# Patient Record
Sex: Male | Born: 1945 | Race: White | Hispanic: No | Marital: Married | Smoking: Never smoker
Health system: Southern US, Community
[De-identification: ages and names within clinical notes are randomized; demographics above are authoritative.]

## PROBLEM LIST (undated history)

## (undated) DIAGNOSIS — E039 Hypothyroidism, unspecified: Secondary | ICD-10-CM

## (undated) DIAGNOSIS — M810 Age-related osteoporosis without current pathological fracture: Secondary | ICD-10-CM

## (undated) DIAGNOSIS — M19011 Primary osteoarthritis, right shoulder: Secondary | ICD-10-CM

## (undated) HISTORY — PX: OTHER SURGICAL HISTORY: SHX169

## (undated) HISTORY — PX: THYROIDECTOMY, PARTIAL: SHX18

## (undated) HISTORY — DX: Hypothyroidism, unspecified: E03.9

## (undated) HISTORY — PX: KNEE ARTHROSCOPY: SUR90

---

## 2007-08-20 ENCOUNTER — Encounter: Admission: RE | Admit: 2007-08-20 | Discharge: 2007-08-20 | Payer: Self-pay | Admitting: Neurosurgery

## 2009-09-11 ENCOUNTER — Encounter: Admission: RE | Admit: 2009-09-11 | Discharge: 2009-09-11 | Payer: Self-pay | Admitting: Neurosurgery

## 2010-09-12 ENCOUNTER — Encounter: Payer: Self-pay | Admitting: Neurosurgery

## 2011-09-14 DIAGNOSIS — K625 Hemorrhage of anus and rectum: Secondary | ICD-10-CM | POA: Diagnosis not present

## 2011-10-13 DIAGNOSIS — K625 Hemorrhage of anus and rectum: Secondary | ICD-10-CM | POA: Diagnosis not present

## 2011-10-13 DIAGNOSIS — K648 Other hemorrhoids: Secondary | ICD-10-CM | POA: Diagnosis not present

## 2011-11-23 DIAGNOSIS — Z85828 Personal history of other malignant neoplasm of skin: Secondary | ICD-10-CM | POA: Diagnosis not present

## 2011-11-23 DIAGNOSIS — L570 Actinic keratosis: Secondary | ICD-10-CM | POA: Diagnosis not present

## 2011-12-20 DIAGNOSIS — H01009 Unspecified blepharitis unspecified eye, unspecified eyelid: Secondary | ICD-10-CM | POA: Diagnosis not present

## 2012-02-17 DIAGNOSIS — H902 Conductive hearing loss, unspecified: Secondary | ICD-10-CM | POA: Diagnosis not present

## 2012-02-17 DIAGNOSIS — H6500 Acute serous otitis media, unspecified ear: Secondary | ICD-10-CM | POA: Diagnosis not present

## 2012-02-27 DIAGNOSIS — H908 Mixed conductive and sensorineural hearing loss, unspecified: Secondary | ICD-10-CM | POA: Diagnosis not present

## 2012-02-27 DIAGNOSIS — H9319 Tinnitus, unspecified ear: Secondary | ICD-10-CM | POA: Diagnosis not present

## 2012-02-27 DIAGNOSIS — H6980 Other specified disorders of Eustachian tube, unspecified ear: Secondary | ICD-10-CM | POA: Diagnosis not present

## 2012-03-09 DIAGNOSIS — K921 Melena: Secondary | ICD-10-CM | POA: Diagnosis not present

## 2012-03-09 DIAGNOSIS — H6590 Unspecified nonsuppurative otitis media, unspecified ear: Secondary | ICD-10-CM | POA: Diagnosis not present

## 2012-03-09 DIAGNOSIS — E039 Hypothyroidism, unspecified: Secondary | ICD-10-CM | POA: Diagnosis not present

## 2012-03-26 DIAGNOSIS — H6980 Other specified disorders of Eustachian tube, unspecified ear: Secondary | ICD-10-CM | POA: Diagnosis not present

## 2012-05-09 DIAGNOSIS — D126 Benign neoplasm of colon, unspecified: Secondary | ICD-10-CM | POA: Diagnosis not present

## 2012-05-09 DIAGNOSIS — K921 Melena: Secondary | ICD-10-CM | POA: Diagnosis not present

## 2012-05-30 DIAGNOSIS — M19019 Primary osteoarthritis, unspecified shoulder: Secondary | ICD-10-CM | POA: Diagnosis not present

## 2012-06-01 ENCOUNTER — Other Ambulatory Visit: Payer: Self-pay | Admitting: Orthopedic Surgery

## 2012-06-01 DIAGNOSIS — M25512 Pain in left shoulder: Secondary | ICD-10-CM

## 2012-06-05 ENCOUNTER — Ambulatory Visit
Admission: RE | Admit: 2012-06-05 | Discharge: 2012-06-05 | Disposition: A | Discharge status: Home / Self Care | Payer: Medicare Other | Source: Ambulatory Visit | Attending: Orthopedic Surgery | Admitting: Orthopedic Surgery

## 2012-06-05 DIAGNOSIS — M67919 Unspecified disorder of synovium and tendon, unspecified shoulder: Secondary | ICD-10-CM | POA: Diagnosis not present

## 2012-06-05 DIAGNOSIS — M75100 Unspecified rotator cuff tear or rupture of unspecified shoulder, not specified as traumatic: Secondary | ICD-10-CM | POA: Diagnosis not present

## 2012-06-05 DIAGNOSIS — M25512 Pain in left shoulder: Secondary | ICD-10-CM

## 2012-06-05 DIAGNOSIS — M719 Bursopathy, unspecified: Secondary | ICD-10-CM | POA: Diagnosis not present

## 2012-06-11 DIAGNOSIS — M25519 Pain in unspecified shoulder: Secondary | ICD-10-CM | POA: Diagnosis not present

## 2012-06-11 DIAGNOSIS — M19019 Primary osteoarthritis, unspecified shoulder: Secondary | ICD-10-CM | POA: Diagnosis not present

## 2012-06-26 DIAGNOSIS — M25519 Pain in unspecified shoulder: Secondary | ICD-10-CM | POA: Diagnosis not present

## 2012-06-26 DIAGNOSIS — M19019 Primary osteoarthritis, unspecified shoulder: Secondary | ICD-10-CM | POA: Diagnosis not present

## 2012-06-29 DIAGNOSIS — L570 Actinic keratosis: Secondary | ICD-10-CM | POA: Diagnosis not present

## 2012-06-29 DIAGNOSIS — C44319 Basal cell carcinoma of skin of other parts of face: Secondary | ICD-10-CM | POA: Diagnosis not present

## 2012-06-29 DIAGNOSIS — Z85828 Personal history of other malignant neoplasm of skin: Secondary | ICD-10-CM | POA: Diagnosis not present

## 2012-06-29 DIAGNOSIS — M62838 Other muscle spasm: Secondary | ICD-10-CM | POA: Diagnosis not present

## 2012-06-29 DIAGNOSIS — D485 Neoplasm of uncertain behavior of skin: Secondary | ICD-10-CM | POA: Diagnosis not present

## 2012-07-03 DIAGNOSIS — M62838 Other muscle spasm: Secondary | ICD-10-CM | POA: Diagnosis not present

## 2012-07-05 DIAGNOSIS — M6688 Spontaneous rupture of other tendons, other: Secondary | ICD-10-CM | POA: Diagnosis not present

## 2012-07-05 DIAGNOSIS — M19019 Primary osteoarthritis, unspecified shoulder: Secondary | ICD-10-CM | POA: Diagnosis not present

## 2012-07-16 DIAGNOSIS — M25519 Pain in unspecified shoulder: Secondary | ICD-10-CM | POA: Diagnosis not present

## 2012-07-16 DIAGNOSIS — M19019 Primary osteoarthritis, unspecified shoulder: Secondary | ICD-10-CM | POA: Diagnosis not present

## 2012-07-23 ENCOUNTER — Other Ambulatory Visit: Payer: Self-pay | Admitting: Orthopedic Surgery

## 2012-07-23 DIAGNOSIS — M25511 Pain in right shoulder: Secondary | ICD-10-CM

## 2012-07-25 DIAGNOSIS — M19019 Primary osteoarthritis, unspecified shoulder: Secondary | ICD-10-CM | POA: Diagnosis not present

## 2012-07-26 DIAGNOSIS — M19019 Primary osteoarthritis, unspecified shoulder: Secondary | ICD-10-CM | POA: Diagnosis not present

## 2012-07-26 DIAGNOSIS — S43429A Sprain of unspecified rotator cuff capsule, initial encounter: Secondary | ICD-10-CM | POA: Diagnosis not present

## 2012-08-02 DIAGNOSIS — M19019 Primary osteoarthritis, unspecified shoulder: Secondary | ICD-10-CM | POA: Diagnosis not present

## 2012-08-13 DIAGNOSIS — H01009 Unspecified blepharitis unspecified eye, unspecified eyelid: Secondary | ICD-10-CM | POA: Diagnosis not present

## 2012-08-13 DIAGNOSIS — H5310 Unspecified subjective visual disturbances: Secondary | ICD-10-CM | POA: Diagnosis not present

## 2012-08-13 DIAGNOSIS — H43399 Other vitreous opacities, unspecified eye: Secondary | ICD-10-CM | POA: Diagnosis not present

## 2012-08-31 DIAGNOSIS — J069 Acute upper respiratory infection, unspecified: Secondary | ICD-10-CM | POA: Diagnosis not present

## 2012-09-04 DIAGNOSIS — M25519 Pain in unspecified shoulder: Secondary | ICD-10-CM | POA: Diagnosis not present

## 2012-09-13 DIAGNOSIS — Z0000 Encounter for general adult medical examination without abnormal findings: Secondary | ICD-10-CM | POA: Diagnosis not present

## 2012-09-13 DIAGNOSIS — M19019 Primary osteoarthritis, unspecified shoulder: Secondary | ICD-10-CM | POA: Diagnosis not present

## 2012-09-13 DIAGNOSIS — Z23 Encounter for immunization: Secondary | ICD-10-CM | POA: Diagnosis not present

## 2012-09-13 DIAGNOSIS — E039 Hypothyroidism, unspecified: Secondary | ICD-10-CM | POA: Diagnosis not present

## 2012-09-13 DIAGNOSIS — Z125 Encounter for screening for malignant neoplasm of prostate: Secondary | ICD-10-CM | POA: Diagnosis not present

## 2012-10-05 DIAGNOSIS — M25519 Pain in unspecified shoulder: Secondary | ICD-10-CM | POA: Diagnosis not present

## 2012-10-19 DIAGNOSIS — M1710 Unilateral primary osteoarthritis, unspecified knee: Secondary | ICD-10-CM | POA: Diagnosis not present

## 2012-11-30 DIAGNOSIS — R229 Localized swelling, mass and lump, unspecified: Secondary | ICD-10-CM | POA: Diagnosis not present

## 2012-12-18 ENCOUNTER — Encounter (INDEPENDENT_AMBULATORY_CARE_PROVIDER_SITE_OTHER): Payer: Self-pay | Admitting: General Surgery

## 2012-12-18 ENCOUNTER — Ambulatory Visit (INDEPENDENT_AMBULATORY_CARE_PROVIDER_SITE_OTHER): Payer: Medicare Other | Admitting: General Surgery

## 2012-12-18 VITALS — BP 102/76 | HR 72 | Resp 16 | Ht 74.0 in | Wt 193.2 lb

## 2012-12-18 DIAGNOSIS — D235 Other benign neoplasm of skin of trunk: Secondary | ICD-10-CM

## 2012-12-18 NOTE — Patient Instructions (Signed)
Activities as tolerated for now.  Light activities after the surgery as we discussed.

## 2012-12-18 NOTE — Progress Notes (Signed)
Patient ID: Greg Santiago, male   DOB: Jan 06, 1946, 67 y.o.   MRN: [id number]  No chief complaint on file.   HPI Greg Santiago is a 67 y.o. male.  HPI  He is referred by Dr. Valentina Lucks because of an enlarging and intermittently painful soft tissue mass in the left lower back. The mass is then there for a number of years but has been slowly getting larger. Certain exercises he does it becomes painful. He is interested in having it removed. He has a few other soft tissue masses similar to it on other areas of his body but they have not changed in size and they are not symptomatic.  Past Medical History  Diagnosis Date  . Hypothyroidism     Past Surgical History  Procedure Laterality Date  . Thyroidectomy, partial N/A   . Left shoulder surgery Left     History reviewed. No pertinent family history.  Social History History  Substance Use Topics  . Smoking status: Not on file  . Smokeless tobacco: Not on file  . Alcohol Use: Not on file    Not on File  Current Outpatient Prescriptions  Medication Sig Dispense Refill  . fish oil-omega-3 fatty acids 1000 MG capsule Take 2 g by mouth daily.      Marland Kitchen levothyroxine (SYNTHROID, LEVOTHROID) 125 MCG tablet Take 125 mcg by mouth daily before breakfast.      . Multiple Vitamin (MULTIVITAMIN) tablet Take 1 tablet by mouth daily.       No current facility-administered medications for this visit.    Review of Systems Review of Systems  Constitutional: Negative.   Respiratory: Negative.   Cardiovascular: Negative.   Gastrointestinal: Negative.   Genitourinary: Negative.   Hematological: Negative.     Blood pressure 102/76, pulse 72, resp. rate 16, height 6\' 2"  (1.88 m), weight 193 lb 3.2 oz (87.635 kg).  Physical Exam Physical Exam  Constitutional: He appears well-developed and well-nourished. No distress.  Musculoskeletal:  Left lower back, there is a 9 cm x 7 cm mobile subcutaneous mass. No significant tenderness. No erythema. No  drainage.    Data Reviewed Dr. Jone Baseman note  Assessment    Enlarging subcutaneous mass of back. This is a direct indication for removal.       Plan    Removal of back subcutaneous mass as an outpatient under local anesthesia. The procedure and risks were discussed with him and his wife. The risks include but are not limited to bleeding, infection, wound healing problems, reaction to local anesthesia. They seem to understand and would like to proceed.        Laporsche Hoeger J 12/18/2012, 2:04 PM

## 2012-12-27 DIAGNOSIS — L570 Actinic keratosis: Secondary | ICD-10-CM | POA: Diagnosis not present

## 2012-12-27 DIAGNOSIS — Z85828 Personal history of other malignant neoplasm of skin: Secondary | ICD-10-CM | POA: Diagnosis not present

## 2012-12-27 DIAGNOSIS — M62838 Other muscle spasm: Secondary | ICD-10-CM | POA: Diagnosis not present

## 2013-01-03 DIAGNOSIS — M25569 Pain in unspecified knee: Secondary | ICD-10-CM | POA: Diagnosis not present

## 2013-01-03 DIAGNOSIS — M1710 Unilateral primary osteoarthritis, unspecified knee: Secondary | ICD-10-CM | POA: Diagnosis not present

## 2013-01-04 ENCOUNTER — Other Ambulatory Visit (INDEPENDENT_AMBULATORY_CARE_PROVIDER_SITE_OTHER): Payer: Self-pay | Admitting: General Surgery

## 2013-01-04 DIAGNOSIS — D1739 Benign lipomatous neoplasm of skin and subcutaneous tissue of other sites: Secondary | ICD-10-CM | POA: Diagnosis not present

## 2013-01-21 ENCOUNTER — Ambulatory Visit (INDEPENDENT_AMBULATORY_CARE_PROVIDER_SITE_OTHER): Payer: Medicare Other | Admitting: General Surgery

## 2013-01-21 ENCOUNTER — Encounter (INDEPENDENT_AMBULATORY_CARE_PROVIDER_SITE_OTHER): Payer: Medicare Other | Admitting: General Surgery

## 2013-01-21 ENCOUNTER — Encounter (INDEPENDENT_AMBULATORY_CARE_PROVIDER_SITE_OTHER): Payer: Self-pay | Admitting: General Surgery

## 2013-01-21 VITALS — BP 116/72 | HR 56 | Temp 97.4°F | Resp 12 | Ht 74.0 in | Wt 194.8 lb

## 2013-01-21 DIAGNOSIS — Z9889 Other specified postprocedural states: Secondary | ICD-10-CM

## 2013-01-21 NOTE — Progress Notes (Signed)
Procedure:  Removal of lipoma from left back.  Date:  01/04/2013  Pathology:  Lipoma  History:  He is here for a postoperative visit and is doing well. He has no complaints  Exam: General- Is in NAD. Back-incision is clean and intact with no evidence of infection.  Assessment:  Doing well postoperatively.  Plan:  Resume normal activities. Followup as needed.

## 2013-01-21 NOTE — Patient Instructions (Signed)
May swim and do all activities.

## 2013-02-19 DIAGNOSIS — M542 Cervicalgia: Secondary | ICD-10-CM | POA: Diagnosis not present

## 2013-02-19 DIAGNOSIS — M999 Biomechanical lesion, unspecified: Secondary | ICD-10-CM | POA: Diagnosis not present

## 2013-02-19 DIAGNOSIS — M546 Pain in thoracic spine: Secondary | ICD-10-CM | POA: Diagnosis not present

## 2013-02-19 DIAGNOSIS — M9981 Other biomechanical lesions of cervical region: Secondary | ICD-10-CM | POA: Diagnosis not present

## 2013-02-21 DIAGNOSIS — M546 Pain in thoracic spine: Secondary | ICD-10-CM | POA: Diagnosis not present

## 2013-02-21 DIAGNOSIS — M999 Biomechanical lesion, unspecified: Secondary | ICD-10-CM | POA: Diagnosis not present

## 2013-02-21 DIAGNOSIS — M542 Cervicalgia: Secondary | ICD-10-CM | POA: Diagnosis not present

## 2013-02-21 DIAGNOSIS — M9981 Other biomechanical lesions of cervical region: Secondary | ICD-10-CM | POA: Diagnosis not present

## 2013-02-25 DIAGNOSIS — M999 Biomechanical lesion, unspecified: Secondary | ICD-10-CM | POA: Diagnosis not present

## 2013-02-25 DIAGNOSIS — M9981 Other biomechanical lesions of cervical region: Secondary | ICD-10-CM | POA: Diagnosis not present

## 2013-02-25 DIAGNOSIS — M542 Cervicalgia: Secondary | ICD-10-CM | POA: Diagnosis not present

## 2013-02-25 DIAGNOSIS — M546 Pain in thoracic spine: Secondary | ICD-10-CM | POA: Diagnosis not present

## 2013-03-19 DIAGNOSIS — M546 Pain in thoracic spine: Secondary | ICD-10-CM | POA: Diagnosis not present

## 2013-03-19 DIAGNOSIS — M9981 Other biomechanical lesions of cervical region: Secondary | ICD-10-CM | POA: Diagnosis not present

## 2013-03-19 DIAGNOSIS — M999 Biomechanical lesion, unspecified: Secondary | ICD-10-CM | POA: Diagnosis not present

## 2013-03-19 DIAGNOSIS — M542 Cervicalgia: Secondary | ICD-10-CM | POA: Diagnosis not present

## 2013-03-21 DIAGNOSIS — M542 Cervicalgia: Secondary | ICD-10-CM | POA: Diagnosis not present

## 2013-03-21 DIAGNOSIS — M546 Pain in thoracic spine: Secondary | ICD-10-CM | POA: Diagnosis not present

## 2013-03-21 DIAGNOSIS — M999 Biomechanical lesion, unspecified: Secondary | ICD-10-CM | POA: Diagnosis not present

## 2013-03-21 DIAGNOSIS — M9981 Other biomechanical lesions of cervical region: Secondary | ICD-10-CM | POA: Diagnosis not present

## 2013-03-25 DIAGNOSIS — M546 Pain in thoracic spine: Secondary | ICD-10-CM | POA: Diagnosis not present

## 2013-03-25 DIAGNOSIS — M9981 Other biomechanical lesions of cervical region: Secondary | ICD-10-CM | POA: Diagnosis not present

## 2013-03-25 DIAGNOSIS — M542 Cervicalgia: Secondary | ICD-10-CM | POA: Diagnosis not present

## 2013-03-25 DIAGNOSIS — M999 Biomechanical lesion, unspecified: Secondary | ICD-10-CM | POA: Diagnosis not present

## 2013-03-28 DIAGNOSIS — M542 Cervicalgia: Secondary | ICD-10-CM | POA: Diagnosis not present

## 2013-03-28 DIAGNOSIS — M546 Pain in thoracic spine: Secondary | ICD-10-CM | POA: Diagnosis not present

## 2013-03-28 DIAGNOSIS — M999 Biomechanical lesion, unspecified: Secondary | ICD-10-CM | POA: Diagnosis not present

## 2013-03-28 DIAGNOSIS — M9981 Other biomechanical lesions of cervical region: Secondary | ICD-10-CM | POA: Diagnosis not present

## 2013-04-02 DIAGNOSIS — M999 Biomechanical lesion, unspecified: Secondary | ICD-10-CM | POA: Diagnosis not present

## 2013-04-02 DIAGNOSIS — M542 Cervicalgia: Secondary | ICD-10-CM | POA: Diagnosis not present

## 2013-04-02 DIAGNOSIS — M9981 Other biomechanical lesions of cervical region: Secondary | ICD-10-CM | POA: Diagnosis not present

## 2013-04-02 DIAGNOSIS — M546 Pain in thoracic spine: Secondary | ICD-10-CM | POA: Diagnosis not present

## 2013-04-05 DIAGNOSIS — M999 Biomechanical lesion, unspecified: Secondary | ICD-10-CM | POA: Diagnosis not present

## 2013-04-05 DIAGNOSIS — M546 Pain in thoracic spine: Secondary | ICD-10-CM | POA: Diagnosis not present

## 2013-04-05 DIAGNOSIS — M9981 Other biomechanical lesions of cervical region: Secondary | ICD-10-CM | POA: Diagnosis not present

## 2013-04-05 DIAGNOSIS — M542 Cervicalgia: Secondary | ICD-10-CM | POA: Diagnosis not present

## 2013-05-15 DIAGNOSIS — M19019 Primary osteoarthritis, unspecified shoulder: Secondary | ICD-10-CM | POA: Diagnosis not present

## 2013-05-15 DIAGNOSIS — S43429A Sprain of unspecified rotator cuff capsule, initial encounter: Secondary | ICD-10-CM | POA: Diagnosis not present

## 2013-05-15 DIAGNOSIS — M25519 Pain in unspecified shoulder: Secondary | ICD-10-CM | POA: Diagnosis not present

## 2013-08-13 DIAGNOSIS — H43399 Other vitreous opacities, unspecified eye: Secondary | ICD-10-CM | POA: Diagnosis not present

## 2013-08-13 DIAGNOSIS — H1045 Other chronic allergic conjunctivitis: Secondary | ICD-10-CM | POA: Diagnosis not present

## 2013-09-05 DIAGNOSIS — M546 Pain in thoracic spine: Secondary | ICD-10-CM | POA: Diagnosis not present

## 2013-09-05 DIAGNOSIS — M999 Biomechanical lesion, unspecified: Secondary | ICD-10-CM | POA: Diagnosis not present

## 2013-09-06 DIAGNOSIS — M999 Biomechanical lesion, unspecified: Secondary | ICD-10-CM | POA: Diagnosis not present

## 2013-09-06 DIAGNOSIS — M546 Pain in thoracic spine: Secondary | ICD-10-CM | POA: Diagnosis not present

## 2013-09-09 DIAGNOSIS — M546 Pain in thoracic spine: Secondary | ICD-10-CM | POA: Diagnosis not present

## 2013-09-09 DIAGNOSIS — M999 Biomechanical lesion, unspecified: Secondary | ICD-10-CM | POA: Diagnosis not present

## 2014-02-10 ENCOUNTER — Other Ambulatory Visit: Payer: Self-pay | Admitting: Dermatology

## 2014-03-18 ENCOUNTER — Other Ambulatory Visit: Payer: Self-pay | Admitting: Internal Medicine

## 2014-03-18 DIAGNOSIS — M79604 Pain in right leg: Secondary | ICD-10-CM

## 2014-03-18 DIAGNOSIS — M545 Low back pain, unspecified: Secondary | ICD-10-CM

## 2014-03-19 ENCOUNTER — Ambulatory Visit
Admission: RE | Admit: 2014-03-19 | Discharge: 2014-03-19 | Disposition: A | Discharge status: Home / Self Care | Payer: Medicare Other | Source: Ambulatory Visit | Attending: Internal Medicine | Admitting: Internal Medicine

## 2014-03-19 DIAGNOSIS — M545 Low back pain, unspecified: Secondary | ICD-10-CM

## 2014-04-04 ENCOUNTER — Other Ambulatory Visit: Payer: Self-pay | Admitting: Internal Medicine

## 2014-04-04 ENCOUNTER — Ambulatory Visit
Admission: RE | Admit: 2014-04-04 | Discharge: 2014-04-04 | Disposition: A | Discharge status: Home / Self Care | Payer: Medicare Other | Source: Ambulatory Visit | Attending: Internal Medicine | Admitting: Internal Medicine

## 2014-04-04 ENCOUNTER — Other Ambulatory Visit: Payer: Medicare Other

## 2014-04-04 VITALS — BP 138/66 | HR 54

## 2014-04-04 DIAGNOSIS — M545 Low back pain, unspecified: Secondary | ICD-10-CM

## 2014-04-04 MED ORDER — METHYLPREDNISOLONE ACETATE 40 MG/ML INJ SUSP (RADIOLOG
120.0000 mg | Freq: Once | INTRAMUSCULAR | Status: AC
  Administered 2014-04-04: 120 mg via EPIDURAL

## 2014-04-04 MED ORDER — IOHEXOL 180 MG/ML  SOLN
1.0000 mL | Freq: Once | INTRAMUSCULAR | Status: AC | PRN
  Administered 2014-04-04: 1 mL via EPIDURAL

## 2014-04-04 NOTE — Discharge Instructions (Signed)

## 2014-04-11 ENCOUNTER — Other Ambulatory Visit: Payer: Self-pay | Admitting: Internal Medicine

## 2014-04-11 DIAGNOSIS — M79604 Pain in right leg: Secondary | ICD-10-CM

## 2014-04-11 DIAGNOSIS — M545 Low back pain: Secondary | ICD-10-CM

## 2014-04-17 ENCOUNTER — Ambulatory Visit
Admission: RE | Admit: 2014-04-17 | Discharge: 2014-04-17 | Disposition: A | Discharge status: Home / Self Care | Payer: Medicare Other | Source: Ambulatory Visit | Attending: Internal Medicine | Admitting: Internal Medicine

## 2014-04-17 VITALS — BP 131/74 | HR 59

## 2014-04-17 DIAGNOSIS — M79604 Pain in right leg: Secondary | ICD-10-CM

## 2014-04-17 DIAGNOSIS — M545 Low back pain: Secondary | ICD-10-CM

## 2014-04-17 MED ORDER — METHYLPREDNISOLONE ACETATE 40 MG/ML INJ SUSP (RADIOLOG
120.0000 mg | Freq: Once | INTRAMUSCULAR | Status: AC
  Administered 2014-04-17: 120 mg via EPIDURAL

## 2014-04-17 MED ORDER — IOHEXOL 180 MG/ML  SOLN
1.0000 mL | Freq: Once | INTRAMUSCULAR | Status: AC | PRN
  Administered 2014-04-17: 1 mL via EPIDURAL

## 2014-04-25 ENCOUNTER — Institutional Professional Consult (permissible substitution): Payer: Medicare Other | Admitting: Internal Medicine

## 2014-05-30 ENCOUNTER — Other Ambulatory Visit: Payer: Self-pay | Admitting: Internal Medicine

## 2014-05-30 DIAGNOSIS — G8929 Other chronic pain: Secondary | ICD-10-CM

## 2014-05-30 DIAGNOSIS — M545 Low back pain: Principal | ICD-10-CM

## 2014-06-10 ENCOUNTER — Institutional Professional Consult (permissible substitution): Payer: Medicare Other | Admitting: Internal Medicine

## 2014-06-13 ENCOUNTER — Inpatient Hospital Stay: Admission: RE | Admit: 2014-06-13 | Payer: Medicare Other | Source: Ambulatory Visit

## 2014-08-18 ENCOUNTER — Ambulatory Visit (INDEPENDENT_AMBULATORY_CARE_PROVIDER_SITE_OTHER): Payer: Medicare Other | Admitting: Internal Medicine

## 2014-08-18 ENCOUNTER — Encounter: Payer: Self-pay | Admitting: Internal Medicine

## 2014-08-18 VITALS — BP 122/78 | HR 68 | Ht 74.0 in | Wt 204.4 lb

## 2014-08-18 DIAGNOSIS — M2550 Pain in unspecified joint: Secondary | ICD-10-CM

## 2014-08-18 NOTE — Progress Notes (Signed)
08/18/14- 68 yoM never smoker Self Referral-inflammation going on and unsure if related to allergies or not. No significant allergy hx. Somehow from a friend he thought I might have something to offer toward his complaint of multiple bilateral joint pains.  He has continued to exercise regularly in retirement. Pains are made worse by activity or sleep and helped by ice and stretching.  He denies significant nasal, chest or skin symptoms and is in generally good health. He had not talked with his PCP about this before coming over.   Prior to Admission medications   Medication Sig Start Date End Date Taking? Authorizing Provider  ALOE VERA JUICE PO Take by mouth.   Yes Historical Provider, MD  cholecalciferol (VITAMIN D) 1000 UNITS tablet Take 2,000 Units by mouth daily.   Yes Historical Provider, MD  Coenzyme Q10 (COQ10 PO) Take 1,000 mg by mouth daily.   Yes Historical Provider, MD  fish oil-omega-3 fatty acids 1000 MG capsule Take 2 g by mouth daily.   Yes Historical Provider, MD  magnesium gluconate (MAGONATE) 500 MG tablet Take 500 mg by mouth 2 (two) times daily.   Yes Historical Provider, MD  Multiple Vitamin (MULTIVITAMIN) tablet Take 1 tablet by mouth daily.   Yes Historical Provider, MD  OVER THE COUNTER MEDICATION Stem U flex  Take 2 tablets twice daily   Yes Historical Provider, MD  SYNTHROID 150 MCG tablet Take 1 tablet by mouth daily. 07/24/14  Yes Historical Provider, MD  vitamin C (ASCORBIC ACID) 500 MG tablet Take 500 mg by mouth daily.   Yes Historical Provider, MD   Past Medical History  Diagnosis Date  . Hypothyroidism    Past Surgical History  Procedure Laterality Date  . Thyroidectomy, partial N/A   . Left shoulder surgery Left    Family History  Problem Relation Age of Onset  . Diabetes Father    History   Social History  . Marital Status: Married    Spouse Name: N/A    Number of Children: N/A  . Years of Education: N/A   Occupational History  . retired     Social History Main Topics  . Smoking status: Never Smoker   . Smokeless tobacco: Not on file  . Alcohol Use: No  . Drug Use: No  . Sexual Activity: Not on file   Other Topics Concern  . Not on file   Social History Narrative   ROS-see HPI Constitutional:   No-   weight loss, night sweats, fevers, chills, fatigue, lassitude. HEENT:   No-  headaches, difficulty swallowing, tooth/dental problems, sore throat,       No-  sneezing, itching, ear ache, nasal congestion, post nasal drip,  CV:  No-   chest pain, orthopnea, PND, swelling in lower extremities, anasarca,                                  dizziness, palpitations Resp: No-   shortness of breath with exertion or at rest.              No-   productive cough,  No non-productive cough,  No- coughing up of blood.              No-   change in color of mucus.  No- wheezing.   Skin: No-   rash or lesions. GI:  No-   heartburn, indigestion, abdominal pain, nausea, vomiting, diarrhea,  change in bowel habits, loss of appetite GU: No-   dysuria, change in color of urine, no urgency or frequency.  No- flank pain. MS: + joint pain or swelling.  + decreased range of motion.  + back pain. Neuro-     nothing unusual Psych:  No- change in mood or affect. No depression or anxiety.  No memory loss.  OBJ- Physical Exam General- Alert, Oriented, Affect-appropriate, Distress- none acute Skin- rash-none, lesions- none, excoriation- none Lymphadenopathy- none Head- atraumatic            Eyes- Gross vision intact, PERRLA, conjunctivae and secretions clear            Ears- Hearing, canals-normal            Nose- Clear, no-Septal dev, mucus, polyps, erosion, perforation             Throat- Mallampati II , mucosa clear , drainage- none, tonsils- atrophic Neck- flexible , trachea midline, no stridor , thyroid nl, carotid no bruit Chest - symmetrical excursion , unlabored           Heart/CV- RRR , no murmur , no gallop  , no rub, nl s1  s2                           - JVD- none , edema- none, stasis changes- none, varices- none           Lung- clear to P&A, wheeze- none, cough- none , dullness-none, rub- none           Chest wall-  Abd- tender-no, distended-no, bowel sounds-present, HSM- no Br/ Gen/ Rectal- Not done, not indicated Extrem- cyanosis- none, clubbing, none, atrophy- none, strength- nl Neuro- grossly intact to observation

## 2014-08-18 NOTE — Patient Instructions (Signed)
We suggested you start by asking Dr Delene Ruffini assessment of your joint inflammation concerns. He may want to order labs such as sed rate, ANA, Rheumatoid factor- just for consideration. He might offer to send you to a Rheumatologist such as Dr Ouida Sills or Dr Charlestine Night.  Dr Adriana Simas and Dr Flossie Buffy are alternative medicine providers in the area.

## 2014-08-19 DIAGNOSIS — M2550 Pain in unspecified joint: Secondary | ICD-10-CM | POA: Insufficient documentation

## 2014-08-19 NOTE — Assessment & Plan Note (Signed)
Nothing suggests a respiratory allergy process, where I might help. More likely he has diffuse osteoarthritis and some degenerative disk disease.  Plan- He is going back to discuss with Dr Laurann Montana. If basic inflammatory markers are abnormal then he might be steered toward a rheumatologist. He pushed me for names, and also asked about alternative medicine providers.

## 2015-02-12 ENCOUNTER — Other Ambulatory Visit: Payer: Self-pay | Admitting: Internal Medicine

## 2015-02-12 ENCOUNTER — Ambulatory Visit
Admission: RE | Admit: 2015-02-12 | Discharge: 2015-02-12 | Disposition: A | Discharge status: Home / Self Care | Payer: Medicare Other | Source: Ambulatory Visit | Attending: Internal Medicine | Admitting: Internal Medicine

## 2015-02-12 DIAGNOSIS — M545 Low back pain, unspecified: Secondary | ICD-10-CM

## 2015-02-12 DIAGNOSIS — G8929 Other chronic pain: Secondary | ICD-10-CM

## 2015-02-12 MED ORDER — IOHEXOL 180 MG/ML  SOLN
1.0000 mL | Freq: Once | INTRAMUSCULAR | Status: AC | PRN
  Administered 2015-02-12: 1 mL via EPIDURAL

## 2015-02-12 MED ORDER — METHYLPREDNISOLONE ACETATE 40 MG/ML INJ SUSP (RADIOLOG
120.0000 mg | Freq: Once | INTRAMUSCULAR | Status: AC
  Administered 2015-02-12: 120 mg via EPIDURAL

## 2015-02-12 NOTE — Discharge Instructions (Signed)

## 2015-02-17 ENCOUNTER — Other Ambulatory Visit: Payer: Self-pay | Admitting: Internal Medicine

## 2015-02-17 ENCOUNTER — Ambulatory Visit
Admission: RE | Admit: 2015-02-17 | Discharge: 2015-02-17 | Disposition: A | Discharge status: Home / Self Care | Payer: Medicare Other | Source: Ambulatory Visit | Attending: Internal Medicine | Admitting: Internal Medicine

## 2015-02-17 ENCOUNTER — Other Ambulatory Visit: Payer: Medicare Other

## 2015-02-17 DIAGNOSIS — M5137 Other intervertebral disc degeneration, lumbosacral region: Secondary | ICD-10-CM

## 2015-02-17 MED ORDER — METHYLPREDNISOLONE ACETATE 40 MG/ML INJ SUSP (RADIOLOG
120.0000 mg | Freq: Once | INTRAMUSCULAR | Status: AC
  Administered 2015-02-17: 120 mg via INTRA_ARTICULAR

## 2015-02-17 MED ORDER — IOHEXOL 180 MG/ML  SOLN
1.0000 mL | Freq: Once | INTRAMUSCULAR | Status: AC | PRN
  Administered 2015-02-17: 1 mL via INTRA_ARTICULAR

## 2015-06-29 ENCOUNTER — Other Ambulatory Visit: Payer: Self-pay | Admitting: Orthopedic Surgery

## 2015-06-29 DIAGNOSIS — M75101 Unspecified rotator cuff tear or rupture of right shoulder, not specified as traumatic: Secondary | ICD-10-CM

## 2015-06-29 DIAGNOSIS — M1990 Unspecified osteoarthritis, unspecified site: Secondary | ICD-10-CM

## 2015-07-03 ENCOUNTER — Other Ambulatory Visit: Payer: Medicare Other

## 2015-07-13 ENCOUNTER — Other Ambulatory Visit: Payer: Medicare Other

## 2016-04-05 DIAGNOSIS — M5136 Other intervertebral disc degeneration, lumbar region: Secondary | ICD-10-CM | POA: Insufficient documentation

## 2016-04-05 DIAGNOSIS — M533 Sacrococcygeal disorders, not elsewhere classified: Secondary | ICD-10-CM | POA: Insufficient documentation

## 2016-04-05 DIAGNOSIS — M47816 Spondylosis without myelopathy or radiculopathy, lumbar region: Secondary | ICD-10-CM | POA: Insufficient documentation

## 2016-09-08 ENCOUNTER — Ambulatory Visit: Payer: Medicare Other | Admitting: Podiatry

## 2016-09-10 DIAGNOSIS — M1712 Unilateral primary osteoarthritis, left knee: Secondary | ICD-10-CM | POA: Diagnosis not present

## 2016-09-10 DIAGNOSIS — M76892 Other specified enthesopathies of left lower limb, excluding foot: Secondary | ICD-10-CM | POA: Diagnosis not present

## 2016-09-10 DIAGNOSIS — M7121 Synovial cyst of popliteal space [Baker], right knee: Secondary | ICD-10-CM | POA: Diagnosis not present

## 2016-09-19 DIAGNOSIS — M19012 Primary osteoarthritis, left shoulder: Secondary | ICD-10-CM | POA: Diagnosis not present

## 2016-09-19 DIAGNOSIS — Z125 Encounter for screening for malignant neoplasm of prostate: Secondary | ICD-10-CM | POA: Diagnosis not present

## 2016-09-19 DIAGNOSIS — E039 Hypothyroidism, unspecified: Secondary | ICD-10-CM | POA: Diagnosis not present

## 2016-09-19 DIAGNOSIS — Z0000 Encounter for general adult medical examination without abnormal findings: Secondary | ICD-10-CM | POA: Diagnosis not present

## 2016-09-19 DIAGNOSIS — M19011 Primary osteoarthritis, right shoulder: Secondary | ICD-10-CM | POA: Diagnosis not present

## 2016-09-19 DIAGNOSIS — Z1389 Encounter for screening for other disorder: Secondary | ICD-10-CM | POA: Diagnosis not present

## 2016-09-19 DIAGNOSIS — M179 Osteoarthritis of knee, unspecified: Secondary | ICD-10-CM | POA: Diagnosis not present

## 2016-09-19 DIAGNOSIS — M5137 Other intervertebral disc degeneration, lumbosacral region: Secondary | ICD-10-CM | POA: Diagnosis not present

## 2016-09-20 ENCOUNTER — Encounter: Payer: Self-pay | Admitting: Podiatry

## 2016-09-20 ENCOUNTER — Ambulatory Visit (INDEPENDENT_AMBULATORY_CARE_PROVIDER_SITE_OTHER): Payer: Medicare Other

## 2016-09-20 ENCOUNTER — Ambulatory Visit (INDEPENDENT_AMBULATORY_CARE_PROVIDER_SITE_OTHER): Payer: Medicare Other | Admitting: Podiatry

## 2016-09-20 VITALS — BP 92/62 | HR 60 | Resp 16 | Ht 74.0 in | Wt 190.0 lb

## 2016-09-20 DIAGNOSIS — L603 Nail dystrophy: Secondary | ICD-10-CM | POA: Diagnosis not present

## 2016-09-20 DIAGNOSIS — B351 Tinea unguium: Secondary | ICD-10-CM | POA: Diagnosis not present

## 2016-09-20 DIAGNOSIS — M2012 Hallux valgus (acquired), left foot: Secondary | ICD-10-CM | POA: Diagnosis not present

## 2016-09-20 DIAGNOSIS — M79672 Pain in left foot: Secondary | ICD-10-CM

## 2016-09-20 NOTE — Progress Notes (Signed)
   Subjective:    Patient ID: Greg Santiago, male    DOB: 08-Jul-1946, 71 y.o.   MRN: [id number]  HPI: Greg Santiago presents today with a chief concern of painful bunion first metatarsal phalangeal joint of the left foot 10 years he states this seems to be getting worse but is something tolerable that he can live with. He is also noticed that he has developed a callus to the medial aspect of the fourth toe of the left foot and the fourth toe rotates underneath the third toe and has 4 many months. He is also concerned about the thickness of his toenails and states that they are painful.    Review of Systems  All other systems reviewed and are negative.      Objective:   Physical Exam: Vital signs are stable alert and oriented 3 in no apparent distress. Pulses are strongly palpable. Neurologic sensorium is intact. Deep tendon reflexes are intact and muscle strength is normal bilateral. Orthopedic evaluation of his drains all joints distal to the ankle for range of motion without crepitation. Cutaneous evaluation of Mr. well-hydrated uterus thick yellow dystrophic onychomycotic nails some local debris and discoloration are noted. He has a large palpable nonpulsatile nodular mass overlying the first metatarsophalangeal joint of the left foot and to a lesser degree on the right foot. He has limited range of motion suspecting hallux limitus and osteoarthritic changes. Radiographs taken today confirm not only and rotated hammertoe deformity fourth digit of the left foot however it also demonstrates hallux rigidus with joint space narrowing subchondral sclerosis dorsal spurring CONSISTENT with osteoarthritic change. He also has a reactive hyperkeratosis medial aspect fourth digit left foot.        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 Reactive hyperkeratosis fourth digit left foot. Painful thick mycotic nails  may be dystrophic.  Plan: Samples of the nail were taken today and sent for pathologic evaluation. We also discussed surgical correction in great detail concerning the first metatarsophalangeal joint to a lesser degree the fourth toe. I also debrided the reactive hyperkeratotic lesion to the toe. I will follow-up with him in 1 month remember to ask how his wife is doing with her back surgery.

## 2016-10-18 ENCOUNTER — Ambulatory Visit: Payer: Medicare Other | Admitting: Podiatry

## 2016-10-26 ENCOUNTER — Ambulatory Visit: Payer: Self-pay

## 2016-10-26 ENCOUNTER — Ambulatory Visit (INDEPENDENT_AMBULATORY_CARE_PROVIDER_SITE_OTHER): Payer: Medicare Other | Admitting: Sports Medicine

## 2016-10-26 ENCOUNTER — Encounter: Payer: Self-pay | Admitting: Sports Medicine

## 2016-10-26 VITALS — BP 100/70 | HR 67 | Ht 74.0 in | Wt 197.0 lb

## 2016-10-26 DIAGNOSIS — S76012A Strain of muscle, fascia and tendon of left hip, initial encounter: Secondary | ICD-10-CM | POA: Diagnosis not present

## 2016-10-26 DIAGNOSIS — M25552 Pain in left hip: Secondary | ICD-10-CM

## 2016-10-26 MED ORDER — CELECOXIB 100 MG PO CAPS: 100.0000 mg | ORAL_CAPSULE | Freq: Two times a day (BID) | ORAL | 2 refills | Status: DC | PRN

## 2016-10-26 NOTE — Patient Instructions (Addendum)
You have a glute minimus strain Avoid anything that hurts.    Gluteal Strain A gluteal strain happens when the muscles in the buttocks (gluteal muscles) are overstretched or torn. A tear can be partial or complete. A gluteal strain can cause pain and stiffness in your buttocks, legs, and lower back. A strain might be referred to as "pulling a muscle." The severity of a gluteal strain is rated in degrees. First-degree strains have the least amount of muscle tearing and pain. Second-degree and third-degrees strains have increasingly more tearing and pain. What are the causes? There are many possible causes of gluteal strain, such as:  Stretching the muscles too far.  Putting too much stress on the muscles before they are warmed up.  Overusing the muscles.  Repetitive muscle movements over long periods of time.  Injury. What increases the risk? This condition is more likely to develop in:  People who are in cold weather.  People who are physically tired.  People with poor strength and flexibility.  People who do not warm up properly before physical activity.  People who do exercises or play sports with sudden bursts of activity.  People who have poor exercise technique. What are the signs or symptoms? Symptoms of this condition include:  Pain in the buttocks, especially when moving the legs. Pain may spread to the lower back or the legs.  Bruising and swelling of the buttocks.  Tenderness, weakness, or stiffness in the buttocks.  Muscle spasms. How is this diagnosed? This condition is diagnosed based on a physical exam and medical history. Your health care provider may do some range of motion exercises with you. You may have tests, such as MRI or X-rays. Your strain may be rated based on how severe it is. The ratings are:  Grade 1 strain (mild). Your muscles are overstretched. You might have very small muscle tears. This type of strain generally heals in about 1  week.  Grade 2 strain (moderate). Your muscles are partially torn. This may take 1 to 2 months to heal.  Grade 3 strain (severe). Your muscles are completely torn. A severe strain can take more than three months to heal. Grade 3 gluteal strains are rare. How is this treated? Treatment for this condition includes resting, icing, and raising (elevating) the injured area as much as possible. Your health care provider may recommend over-the-counter pain medicines. If your gluteal strain is severe or very painful, your health care provider may prescribe pain medicines or physical therapy. Surgery for severe strains is rare. Follow these instructions at home:  Take over-the-counter and prescription medicines only as told by your health care provider.  Do not drive or operate heavy machinery while taking prescription pain medicine.  Return to your normal activities as told by your health care provider. Ask your health care provider what activities are safe for you.  Rest your gluteal muscles as much as possible, especially for the first 2-3 days.  Begin exercising or stretching as told by your health care provider.  If directed, apply ice to the injured area:  Put ice in a plastic bag.  Place a towel between your skin and the bag.  Leave the ice on for 20 minutes, 2-3 times per day.  Keep all follow-up visits as told by your health care provider. This is important. How is this prevented?  Warm up and stretch before physical activity.  Stretch after physical activity.  Learn and use correct techniques for exercising and playing sports.  Avoid difficult physical activities if your muscles are tired or sore.  Strengthen your gluteal muscles and the surrounding muscles.  Develop your flexibility by stretching at least once a day. Contact a health care provider if:  You have pain or swelling that gets worse or does not get better with medicine.  You have "shooting" pain that moves  through your leg. Get help right away if:  You develop weakness in part of your body.  You lose feeling in part of your body.  You have severe pain.  You are unable to walk.  You have difficulty controlling your bladder or bowel movements. This information is not intended to replace advice given to you by your health care provider. Make sure you discuss any questions you have with your health care provider. Document Released: 06/05/2009 Document Revised: 01/14/2016 Document Reviewed: 12/24/2014 Elsevier Interactive Patient Education  2017 [employer].

## 2016-10-26 NOTE — Progress Notes (Signed)
Pre visit review using our clinic review tool, if applicable. No additional management support is needed unless otherwise documented below in the visit note. 

## 2016-10-27 DIAGNOSIS — S76012A Strain of muscle, fascia and tendon of left hip, initial encounter: Secondary | ICD-10-CM | POA: Insufficient documentation

## 2016-10-27 NOTE — Progress Notes (Signed)
Greg Santiago - 71 y.o. male MRN [id number]  Date of birth: 1946-07-25  Office Visit Note: Visit Date: 10/26/2016 PCP: Irven Shelling, MD Referred by: Lavone Orn, MD  Subjective: Chief Complaint  Patient presents with  . Left Hip Injury on 10/26/2016    Incident happened while swinging a golf club, felt the pelvic/hip area "muscle"grabbed with a sharp pain. Walking up/down steps and the finish of his golf swing triggers the pain. Denies any radiation of pain. Previous HX of soreness in the area but feels its caused by daily exercise routines.    HPI: Patient presents after acute injury today while playing golf with the [employer].  He had acute onset of left anterior groin/thigh pain.  This area is been problematic for him for several weeks and he has been working with a Physiological scientist and using a foam roller aggressively with only minimal improvement in that timeframe.  He reports feeling acute onset when this occurred today.  He has taken ibuprofen with moderate improvement in his symptoms but he has a history of esophagitis.  He has been performing therapeutic exercises with his personal trainer working on Product/process development scientist.  He does have history of low back issues with this seem significantly different than that.    ROS: Denies fevers, chills, recent weight gain or weight loss.  No night sweats. No significant nighttime awakenings due to this issue.  Otherwise per HPI.  Objective:  VS:  HT:6\' 2"  (188 cm)   WT:197 lb (89.4 kg)  BMI:25.3    BP:100/70  HR:67bpm  TEMP: ( )  RESP:97 % Physical Exam: GENERAL:  WDWN, NAD, Non-toxic appearing PSYCH:  Alert & appropriately interactive  Not depressed or anxious appearing LOWER EXTREMITIES:  Patient has bruising along the anterior lateral iliac crest there is petechial in nature and consistent with traction injury.  No significant large ecchymosis.  No significant pretibial edema.  No clubbing  or cyanosis.  DP & PT pulses 2+/4.  Sensation intact to light touch. BACK:   Bilateral negative straight leg raise.  No significant midline tenderness.  Tender to palpation over the left gluteal minimus and minimal TTP over the left greater trochanter  Good internal and external rotation of the hips.  Normal logroll, Stinchfield and FADIR/Faber  Manual muscle testing is 5+/5 in BLE myotomes without focality  Lower extremity DTRs 2+/4 diffusely and symmetric  He does have marked pain with isolated gluten minimus testing and a AB adducted and externally rotated position.   Imaging & Procedures: No results found. LIMITED MSK ULTRASOUND OF left hip Images were obtained and interpreted by myself, Teresa Coombs, DO  Images have been saved and stored to PACS system. Images obtained on: GE S7 Ultrasound machine  FINDINGS:   Greater trochanters overall normal appearing.  Intra-articular structures have only minimal degenerative change with no hip effusion.  Glute minimus muscle belly has a mid substance area of hypoechoic change with associated neovascularity  Iliopsoas tendon is mildly calcific  IMPRESSION:  1. Glute minimus grade 2 tear    Assessment & Plan: Problem List Items Addressed This Visit    Muscle strain of left gluteal region - Primary    Symptoms are consistent with a acute injury to underlying chronic issue.  I suspect he has had some underlying dysfunction that was exacerbated during his golf swing today.  We will plan for anti-inflammatories ice and avoidance of exacerbating activities for the next several weeks.  Follow-up in  2 weeks for consideration of transition to nitroglycerin patch, repeat MSK ultrasound and likely additional therapeutic exercises with focus on glute minimus at that time       Other Visit Diagnoses    Left hip pain       Relevant Orders   [place] LIMITED JOINT SPACE STRUCTURES LOW LEFT      Follow-up: Return in about 2 weeks  (around 11/09/2016).   Past Medical/Family/Surgical/Social History: Medications & Allergies reviewed per EMR Patient Active Problem List   Diagnosis Date Noted  . Muscle strain of left gluteal region 10/27/2016  . Polyarthralgia 08/19/2014  . Benign neoplasm of skin of trunk, left lower back 12/18/2012   Past Medical History:  Diagnosis Date  . Hypothyroidism    Family History  Problem Relation Age of Onset  . Diabetes Father    Past Surgical History:  Procedure Laterality Date  . left shoulder surgery Left   . THYROIDECTOMY, PARTIAL N/A    Social History   Occupational History  . retired    Social History Main Topics  . Smoking status: Never Smoker  . Smokeless tobacco: Never Used  . Alcohol use No  . Drug use: No  . Sexual activity: Not on file

## 2016-10-27 NOTE — Assessment & Plan Note (Signed)
Symptoms are consistent with a acute injury to underlying chronic issue.  I suspect he has had some underlying dysfunction that was exacerbated during his golf swing today.  We will plan for anti-inflammatories ice and avoidance of exacerbating activities for the next several weeks.  Follow-up in 2 weeks for consideration of transition to nitroglycerin patch, repeat MSK ultrasound and likely additional therapeutic exercises with focus on glute minimus at that time

## 2016-11-02 ENCOUNTER — Ambulatory Visit (INDEPENDENT_AMBULATORY_CARE_PROVIDER_SITE_OTHER): Payer: Medicare Other | Admitting: Sports Medicine

## 2016-11-02 ENCOUNTER — Encounter: Payer: Self-pay | Admitting: Sports Medicine

## 2016-11-02 VITALS — BP 104/80 | HR 74 | Ht 74.0 in | Wt 192.0 lb

## 2016-11-02 DIAGNOSIS — M25552 Pain in left hip: Secondary | ICD-10-CM

## 2016-11-02 DIAGNOSIS — S76012A Strain of muscle, fascia and tendon of left hip, initial encounter: Secondary | ICD-10-CM

## 2016-11-02 MED ORDER — NITROGLYCERIN 0.2 MG/HR TD PT24: MEDICATED_PATCH | TRANSDERMAL | 1 refills | Status: DC

## 2016-11-02 NOTE — Progress Notes (Signed)
Greg Santiago - 71 y.o. male MRN [id number]  Date of birth: 07/09/1946  Office Visit Note: Visit Date: 11/02/2016 PCP: Irven Shelling, MD Referred by: Lavone Orn, MD  Subjective: Chief Complaint  Patient presents with  . Follow-up Muscle Strain of the Gluteal Region    Sx are improving since last visit. He is icing the area from 4-6 times per day. Pt is taking Celebrex which he can tell some diference, no reported s/e. He has been doing some in home exercises which he would like to discuss in detail with Provider.   HPI: Patient reports he is actually doing remarkably better than at last visit.  He has multiple questions today regarding appropriate therapeutic exercises.  Celebrex has been significantly helpful ROS:   Otherwise per HPI.  Objective:  VS:  HT:6\' 2"  (188 cm)   WT:192 lb (87.1 kg)  BMI:24.7    BP:104/80  HR:74bpm  TEMP: ( )  RESP:97 % Physical Exam: GENERAL:  WDWN, NAD, Non-toxic appearing PSYCH:  Alert & appropriately interactive  Not depressed or anxious appearing Left hip: Only mild tenderness to palpation over the left Glute minimus/lateral pelvic girdle.  Overall good range of motion.  Still focally weak and pain reported with isolated Glute minimus testing.  Bilateral negative straight leg raise.  Negative logroll, FADIR, [place].  Imaging & Procedures: No results found.   PROCEDURE NOTE: THERAPEUTIC EXERCISES (97110) 15 minutes spent for Therapeutic exercises as stated in above notes.  This included exercises focusing on stretching, strengthening, with significant focus on eccentric aspects.   Proper technique shown and discussed handout in great detail with myself.  This was spent in addition to the 20 minutes spent in direct patient counseling and coordination of care.  Total visit time of 35 minutes.  All questions were discussed and answered.    Assessment & Plan: Problem List Items Addressed This Visit    Muscle strain of left gluteal  region - Primary    Extensive time spent in one-to-one education regarding appropriate therapeutic exercises.  Patient is doing a extensive core routine which is significantly helped his back in the past.  He is also recently changed his golf swing working with the probe at [employer].  He has not seen a physical therapist for these types of conditions in the past.  He has tried dry needling in the past and is interested in looking this more and do think this could be beneficial.  I will plan to send him to Mali Parker at [facility] physical therapy for evaluation of his golf swing.  We will additionally begin working on nitroglycerin protocol in 1 week after the acute phase and this was discussed in detail with him today.  We will plan to reevaluate in 4 weeks with repeat ultrasound if any persistent symptoms.        Relevant Orders   Ambulatory referral to Physical Therapy    Other Visit Diagnoses    Left hip pain       Relevant Orders   Ambulatory referral to Physical Therapy      Follow-up: Return in about 3 weeks (around 11/23/2016) for repeat diagnostic ultrasound.   Past Medical/Family/Surgical/Social History: Medications & Allergies reviewed per EMR Patient Active Problem List   Diagnosis Date Noted  . Muscle strain of left gluteal region 10/27/2016  . Polyarthralgia 08/19/2014  . Benign neoplasm of skin of trunk, left lower back 12/18/2012   Past Medical History:  Diagnosis Date  .  Hypothyroidism    Family History  Problem Relation Age of Onset  . Diabetes Father    Past Surgical History:  Procedure Laterality Date  . left shoulder surgery Left   . THYROIDECTOMY, PARTIAL N/A    Social History   Occupational History  . retired    Social History Main Topics  . Smoking status: Never Smoker  . Smokeless tobacco: Never Used  . Alcohol use No  . Drug use: No  . Sexual activity: Not on file

## 2016-11-02 NOTE — Patient Instructions (Addendum)
I am referring you to Mali Parker, PT at [facility].  We will place a referral but his telephone number is [phone].  He will be able to help you with your golf swing.  Please follow-up in 3 weeks.   Nitroglycerin Protocol   Apply 1/4 nitroglycerin patch to affected area daily.  Change position of patch within the affected area every 24 hours.  You may experience a headache during the first 1-2 weeks of using the patch, these should subside.  If you experience headaches after beginning nitroglycerin patch treatment, you may take your preferred over the counter pain reliever.  Another side effect of the nitroglycerin patch is skin irritation or rash related to patch adhesive.  Please notify our office if you develop more severe headaches or rash, and stop the patch.  Tendon healing with nitroglycerin patch may require 12 to 24 weeks depending on the extent of injury.  Men should not use if taking Viagra, Cialis, or Levitra.   Do not use if you have migraines or rosacea.

## 2016-11-06 NOTE — Assessment & Plan Note (Addendum)
Extensive time spent in one-to-one education regarding appropriate therapeutic exercises.  Patient is doing a extensive core routine which is significantly helped his back in the past.  He is also recently changed his golf swing working with the probe at [employer].  He has not seen a physical therapist for these types of conditions in the past.  He has tried dry needling in the past and is interested in looking this more and do think this could be beneficial.  I will plan to send him to Mali Parker at [facility] physical therapy for evaluation of his golf swing.  We will additionally begin working on nitroglycerin protocol in 1 week after the acute phase and this was discussed in detail with him today.  We will plan to reevaluate in 4 weeks with repeat ultrasound if any persistent symptoms.

## 2016-11-09 ENCOUNTER — Ambulatory Visit: Payer: Medicare Other | Admitting: Sports Medicine

## 2016-11-16 DIAGNOSIS — S76012A Strain of muscle, fascia and tendon of left hip, initial encounter: Secondary | ICD-10-CM | POA: Diagnosis not present

## 2016-11-21 DIAGNOSIS — S76012A Strain of muscle, fascia and tendon of left hip, initial encounter: Secondary | ICD-10-CM | POA: Diagnosis not present

## 2016-11-23 ENCOUNTER — Ambulatory Visit: Payer: Self-pay

## 2016-11-23 ENCOUNTER — Ambulatory Visit (INDEPENDENT_AMBULATORY_CARE_PROVIDER_SITE_OTHER): Payer: Medicare Other | Admitting: Sports Medicine

## 2016-11-23 ENCOUNTER — Encounter: Payer: Self-pay | Admitting: Sports Medicine

## 2016-11-23 VITALS — BP 102/70 | HR 80 | Ht 74.0 in | Wt 193.4 lb

## 2016-11-23 DIAGNOSIS — S76012D Strain of muscle, fascia and tendon of left hip, subsequent encounter: Secondary | ICD-10-CM

## 2016-11-23 NOTE — Patient Instructions (Signed)
Try using the nitroglycerin patch on both sides and we marked today.  Continue with your exercises.  I am okay with you beginning to take chip shots and progressing over the next 2 weeks.  Anticipate your being able to return to the 9 hole golf game in 2 weeks.

## 2016-11-23 NOTE — Progress Notes (Signed)
OFFICE VISIT NOTE Greg Santiago, [facility]  Masato Pettie - 71 y.o. male MRN [id number]  Date of birth: [birth year]  Visit Date: 11/23/2016  PCP: Lavone Orn, MD   Referred by: Lavone Orn, MD  SUBJECTIVE:   Chief Complaint  Patient presents with  . Muscle strain of left gluteal region    Pt here to f/u on muscle strain. His sx improved but have not completely resolved. Pain is about 0-1. Pain is just with certain movements (sneezing, laying on the left side, sudden movement, golf swing).    HPI: As above. Additional pertinent information includes:  Overall significant better.  Continue with physical therapy.  He is progressing with his activities and has no pain with day-to-day activity.  Interested in returning to golf.   ROS  Otherwise per HPI.  HISTORY & PERTINENT PRIOR DATA:  No specialty comments available. He reports that he has never smoked. He has never used smokeless tobacco. No results for input(s): HGBA1C, LABURIC in the last 8760 hours. Medications & Allergies reviewed per EMR Patient Active Problem List   Diagnosis Date Noted  . Primary osteoarthritis of left knee 12/21/2016  . Chronic pain of left knee 12/21/2016  . Muscle strain of left gluteal region 10/27/2016  . Polyarthralgia 08/19/2014  . Benign neoplasm of skin of trunk, left lower back 12/18/2012   Past Medical History:  Diagnosis Date  . Hypothyroidism    Family History  Problem Relation Age of Onset  . Diabetes Father    Past Surgical History:  Procedure Laterality Date  . left shoulder surgery Left   . THYROIDECTOMY, PARTIAL N/A    Social History   Occupational History  . retired    Social History Main Topics  . Smoking status: Never Smoker  . Smokeless tobacco: Never Used  . Alcohol use No  . Drug use: No  . Sexual activity: Not on file    OBJECTIVE:  VS:  HT:6\' 2"  (188 cm)   WT:193 lb 6.4 oz  (87.7 kg)  BMI:24.9    BP:102/70  HR:80bpm  TEMP: ( )  RESP:94 % Physical Exam: WDWN, NAD, Non-toxic appearing Alert & appropriately interactive Not depressed or anxious appearing No increased work of breathing. Pupils are equal. EOM intact without nystagmus No clubbing or cyanosis of the extremities appreciated No significant rashes/lesions/ulcerations overlying the examined area. DP & PT pulses 2+/4.  No significant pretibial edema. Sensation intact to light touch in upper and lower extremities. Small amount of weakness with resisted hip abduction and.  Tensor fascia lata predominance remains.   IMAGING & PROCEDURES: No results found. No additional findings.  LIMITED MSK ULTRASOUND OF LEFT HIP Images were obtained and interpreted by myself, Teresa Coombs, DO  Images have been saved and stored to PACS system. Images obtained on: GE S7 Ultrasound machine  FINDINGS:   Persistent small hypoechoic area within the anterior fibers of the glute medius as well as the posterior fibers of the tensor fascia lata.  Moderate amount of neovascularity.  IMPRESSION:  1. Persistent left hip abductor strain with overall improvement   ASSESSMENT & PLAN:  Visit Diagnoses:  1. Muscle strain of left gluteal region, subsequent encounter    Meds: No orders of the defined types were placed in this encounter.   Orders:  Orders Placed This Encounter  Procedures  . [place] LIMITED JOINT SPACE STRUCTURES LOW LEFT(NO LINKED CHARGES)    Follow-up: No Follow-up  on file.   Otherwise please see problem oriented charting as below.

## 2016-11-24 DIAGNOSIS — S76012A Strain of muscle, fascia and tendon of left hip, initial encounter: Secondary | ICD-10-CM | POA: Diagnosis not present

## 2016-11-28 DIAGNOSIS — H2513 Age-related nuclear cataract, bilateral: Secondary | ICD-10-CM | POA: Diagnosis not present

## 2016-11-28 DIAGNOSIS — H40013 Open angle with borderline findings, low risk, bilateral: Secondary | ICD-10-CM | POA: Diagnosis not present

## 2016-11-28 DIAGNOSIS — S76012A Strain of muscle, fascia and tendon of left hip, initial encounter: Secondary | ICD-10-CM | POA: Diagnosis not present

## 2016-12-01 DIAGNOSIS — S76012A Strain of muscle, fascia and tendon of left hip, initial encounter: Secondary | ICD-10-CM | POA: Diagnosis not present

## 2016-12-05 DIAGNOSIS — S76012A Strain of muscle, fascia and tendon of left hip, initial encounter: Secondary | ICD-10-CM | POA: Diagnosis not present

## 2016-12-13 DIAGNOSIS — S76012A Strain of muscle, fascia and tendon of left hip, initial encounter: Secondary | ICD-10-CM | POA: Diagnosis not present

## 2016-12-21 ENCOUNTER — Ambulatory Visit: Payer: Self-pay

## 2016-12-21 ENCOUNTER — Ambulatory Visit (INDEPENDENT_AMBULATORY_CARE_PROVIDER_SITE_OTHER): Payer: Medicare Other | Admitting: Sports Medicine

## 2016-12-21 ENCOUNTER — Encounter: Payer: Self-pay | Admitting: Sports Medicine

## 2016-12-21 VITALS — BP 100/70 | HR 70 | Ht 74.0 in | Wt 193.8 lb

## 2016-12-21 DIAGNOSIS — S76012D Strain of muscle, fascia and tendon of left hip, subsequent encounter: Secondary | ICD-10-CM

## 2016-12-21 DIAGNOSIS — M25562 Pain in left knee: Secondary | ICD-10-CM

## 2016-12-21 DIAGNOSIS — G8929 Other chronic pain: Secondary | ICD-10-CM | POA: Diagnosis not present

## 2016-12-21 DIAGNOSIS — M1712 Unilateral primary osteoarthritis, left knee: Secondary | ICD-10-CM | POA: Insufficient documentation

## 2016-12-21 NOTE — Patient Instructions (Addendum)
Daisy Blossom is the massage therapist I recommend. [email or web address] [address] [place], [place] [address] ?Clarise Cruz: [phone]  I am happy to call in refills for the nitroglycerin if needed. If you like to try the Voltaren gel please let me know. I am also happy to see you back for Synvisc injections if you would like.  Please call let us know you are interested in having Synvisc at the time of scheduling your appointment.   You can use the compression sleeve at any time throughout the day but is most important to use while being active as well as for 2 hours post-activity.   It is appropriate to ice following activity with the compression sleeve in place.

## 2016-12-21 NOTE — Progress Notes (Signed)
OFFICE VISIT NOTE Greg Santiago. Greg Santiago, [facility]  Greg Santiago - 71 y.o. male MRN [id number]  Date of birth: 1946/07/10  Visit Date: 12/21/2016  PCP: Lavone Orn, MD   Referred by: Lavone Orn, MD  Jari Sportsman, cma acting as scribe for Dr. Paulla Fore.  SUBJECTIVE:   Chief Complaint  Patient presents with  . Follow-up Left Gluteal Strain   HPI: As below and per problem based documentation when appropriate.  Greg Santiago reports improvement in his pain. Took Celebrex 100mg  1qd with relief but d/c due to long term affects. He has continued using Nitro patches as directed with relief. He has very minimal pain. He was able to play 18 holes yesterday with no issues.    Overall very happy with his progress at this time.    ROS  Otherwise per HPI.  HISTORY & PERTINENT PRIOR DATA:  No specialty comments available. He reports that he has never smoked. He has never used smokeless tobacco. No results for input(s): HGBA1C, LABURIC in the last 8760 hours. Medications & Allergies reviewed per EMR Patient Active Problem List   Diagnosis Date Noted  . Primary osteoarthritis of left knee 12/21/2016  . Chronic pain of left knee 12/21/2016  . Muscle strain of left gluteal region 10/27/2016  . Polyarthralgia 08/19/2014  . Benign neoplasm of skin of trunk, left lower back 12/18/2012   Past Medical History:  Diagnosis Date  . Hypothyroidism    Family History  Problem Relation Age of Onset  . Diabetes Father    Past Surgical History:  Procedure Laterality Date  . left shoulder surgery Left   . THYROIDECTOMY, PARTIAL N/A    Social History   Occupational History  . retired    Social History Main Topics  . Smoking status: Never Smoker  . Smokeless tobacco: Never Used  . Alcohol use No  . Drug use: No  . Sexual activity: Not on file    OBJECTIVE:  VS:  HT:6\' 2"  (188 cm)   WT:193 lb 12.8 oz (87.9 kg)  BMI:24.9     BP:100/70  HR:70bpm  TEMP: ( )  RESP:98 % Physical Exam Ortho Exam  IMAGING & PROCEDURES: No results found. Findings:  WDWN, NAD, Non-toxic appearing Alert & appropriately interactive Not depressed or anxious appearing No increased work of breathing. Pupils are equal. EOM intact withou2t nystagmus No clubbing or cyanosis of the extremities appreciated No significant rashes/lesions/ulcerations overlying the examined area. DP & PT pulses 2+/4.  No significant pretibial edema.  No clubbing or cyanosis Sensation intact to light touch in lower extremities.  Left hip: Overall well aligned.  Improved musculature.  No significant TTP today although slight over the midportion of the tensor fascia lata. No pain over the glute medius.  Left knee: Minimal degenerative bossing.  No significant effusion.  Stable to varus and valgus strain.      ASSESSMENT & PLAN:   Problem List Items Addressed This Visit    Muscle strain of left gluteal region - Primary    Overall this is significantly improved.  He has been diligent with his exercises as well as his nitroglycerin patch.  Is not having any adverse effects and will plan to continue this for an additional 2 weeks and he can try to wean off of that as tolerated.  If any lack of improvement I would like to see him back for reevaluation.  Avoid significant trunk abduction with golf.  Primary osteoarthritis of left knee    Patient has been previously followed at [facility].  I am happy to manage him from a nonsurgical standpoint and discussed this today.  We can consider Visco supplementation in the future if his knee continues to flareup for him.  We did discuss using Body Helix compression sleeve and intermittent Celebrex.      Chronic pain of left knee    His knee has been bothersome for him for quite some time.  We discussed strengthening of his core as well as VMO will be critical to maintaining his function.  Body Helix  compression sleeve provided today.  Follow-up for Visco supplementation at his convenience.         Follow-up: Return if symptoms worsen or fail to improve.   CMA/ATC served as Education administrator during this visit. History, Physical, and Plan performed by medical provider. Documentation and orders reviewed and attested to.      Teresa Coombs, [facility] Physician

## 2016-12-26 NOTE — Assessment & Plan Note (Signed)
He does appear to have a strain of the both tensor fascia lata as well as glute medius tendons.  We will have him continue with the exercises previously performing and have him add 1 additional patch. All questions answered today.  >50% of this 25 minute visit spent in direct patient counseling and/or coordination of care.  Discussion was focused on education regarding the in discussing the pathoetiology and anticipated clinical course of the above condition.

## 2017-01-16 NOTE — Assessment & Plan Note (Signed)
His knee has been bothersome for him for quite some time.  We discussed strengthening of his core as well as VMO will be critical to maintaining his function.  Body Helix compression sleeve provided today.  Follow-up for Visco supplementation at his convenience.

## 2017-01-16 NOTE — Assessment & Plan Note (Signed)
Overall this is significantly improved.  He has been diligent with his exercises as well as his nitroglycerin patch.  Is not having any adverse effects and will plan to continue this for an additional 2 weeks and he can try to wean off of that as tolerated.  If any lack of improvement I would like to see him back for reevaluation.  Avoid significant trunk abduction with golf.

## 2017-01-16 NOTE — Assessment & Plan Note (Signed)
Patient has been previously followed at [facility].  I am happy to manage him from a nonsurgical standpoint and discussed this today.  We can consider Visco supplementation in the future if his knee continues to flareup for him.  We did discuss using Body Helix compression sleeve and intermittent Celebrex.

## 2017-01-30 ENCOUNTER — Encounter: Payer: Self-pay | Admitting: Sports Medicine

## 2017-01-30 ENCOUNTER — Ambulatory Visit (INDEPENDENT_AMBULATORY_CARE_PROVIDER_SITE_OTHER): Payer: Medicare Other

## 2017-01-30 ENCOUNTER — Ambulatory Visit (INDEPENDENT_AMBULATORY_CARE_PROVIDER_SITE_OTHER): Payer: Medicare Other | Admitting: Sports Medicine

## 2017-01-30 ENCOUNTER — Ambulatory Visit: Payer: Self-pay

## 2017-01-30 VITALS — BP 108/70 | HR 69 | Ht 74.0 in | Wt 192.4 lb

## 2017-01-30 DIAGNOSIS — M19112 Post-traumatic osteoarthritis, left shoulder: Secondary | ICD-10-CM | POA: Insufficient documentation

## 2017-01-30 DIAGNOSIS — M19019 Primary osteoarthritis, unspecified shoulder: Secondary | ICD-10-CM | POA: Diagnosis not present

## 2017-01-30 DIAGNOSIS — G8929 Other chronic pain: Secondary | ICD-10-CM

## 2017-01-30 DIAGNOSIS — M25512 Pain in left shoulder: Secondary | ICD-10-CM

## 2017-01-30 NOTE — Patient Instructions (Addendum)
Please perform the exercise program that Greg Santiago has prepared for you and gone over in detail on a daily basis.  In addition to the handout you were provided you can access your program through: [email or web address]   Your unique program code is: [facility]

## 2017-01-30 NOTE — Progress Notes (Signed)
OFFICE VISIT NOTE Greg Santiago. Greg Santiago, [place] [facility] [place]  Greg Santiago - 71 y.o. male MRN [id number]  Date of birth: [birth year]  Visit Date: 01/30/2017  PCP: Lavone Orn, MD   Referred by: Lavone Orn, MD  Burlene Arnt, CMA acting as scribe for Dr. Paulla Fore.  SUBJECTIVE:   Chief Complaint  Patient presents with  . left shoulder pain   HPI: As below and per problem based documentation when appropriate.  Pt presents today with complaint of pain in the left shoulder.  Pain has been present for 30 years. He had shoulder surgery in 1975, Bristow repair. 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 He is here today because he had a lot of pain when swinging his club while golfing. He has a constant soreness in the shoulder but pain got worse yesterday so he wanted to follow-up with Dr. Paulla Fore.   The pain is described as constant dull ache and is rated as 1/10. When he swings his golf club there is a pinching pain. Pain is about 6/10 when swinging a club. The stabbing pain is on the anterior aspect of the shoulder.   Improves with icing the shoulder, resting.  Therapies tried include : Voltaren gel, he used this for this first time last night before bed.   Other associated symptoms include: Pain sometimes radiates down into the left arm but stops at the elbow.  Pt reports not recent xray of the left shoulder.   Pt. Denies fever, chills, night sweats, unintentional weight loss or gain.     Review of Systems  Constitutional: Negative for chills and fever.  Respiratory: Negative for shortness of breath and wheezing.   Cardiovascular: Negative for chest pain, palpitations and leg swelling.  Musculoskeletal: Negative for falls.  Neurological: Negative for dizziness, tingling and headaches.  Endo/Heme/Allergies: Does not bruise/bleed easily.    Otherwise per  HPI.  HISTORY & PERTINENT PRIOR DATA:  No specialty comments available. He reports that he has never smoked. He has never used smokeless tobacco. No results for input(s): HGBA1C, LABURIC in the last 8760 hours. Medications & Allergies reviewed per EMR Patient Active Problem List   Diagnosis Date Noted  . Osteoarthritis of left shoulder 01/30/2017  . Primary osteoarthritis of left knee 12/21/2016  . Chronic pain of left knee 12/21/2016  . Muscle strain of left gluteal region 10/27/2016  . Polyarthralgia 08/19/2014  . Benign neoplasm of skin of trunk, left lower back 12/18/2012   Past Medical History:  Diagnosis Date  . Hypothyroidism    Family History  Problem Relation Age of Onset  . Diabetes Father    Past Surgical History:  Procedure Laterality Date  . left shoulder surgery Left   . THYROIDECTOMY, PARTIAL N/A    Social History   Occupational History  . retired    Social History Main Topics  . Smoking status: Never Smoker  . Smokeless tobacco: Never Used  . Alcohol use No  . Drug use: No  . Sexual activity: Not on file    OBJECTIVE:  VS:  HT:6\' 2"  (188 cm)   WT:192 lb 6.4 oz (87.3 kg)  BMI:24.8    BP:108/70  HR:69bpm  TEMP: ( )  RESP:95 % EXAM: Findings:  WDWN, NAD, Non-toxic appearing Alert & appropriately interactive Not depressed or anxious appearing No increased work of breathing. Pupils are equal. EOM  intact without nystagmus No clubbing or cyanosis of the extremities appreciated No significant rashes/lesions/ulcerations overlying the examined area. Radial pulses 2+/4.  No significant generalized UE edema. Sensation intact to light touch in upper extremities.    Left shoulder: Overall well aligned.  He has a large postsurgical scar across the anterior aspect of the deltopectoral groove.  No evidence of erythema or focal swelling.  He has marked internal and external rotation deficiency on the left compared to the right.  Shoulder abduction is to 60.   Internal rotation strength is 4 out of 5, external rotation strength is 4- out of 5.  Minimal pain with empty can testing.  No pain with Hawkins or Neer's.  Marked crepitation and grinding with axial load and circumduction and worse with resisted internal rotation but no pain in this position.     Dg Shoulder Left  Result Date: 01/30/2017 CLINICAL DATA:  Chronic shoulder pain. Remote history of shoulder surgery. EXAM: LEFT SHOULDER - 2+ VIEW COMPARISON:  Left shoulder MRI - 06/05/2012 FINDINGS: No fracture or dislocation. Single cancellous screws noted involving the inferior aspect the left glenoid. Lucency surrounds the cancellous screw potentially indicative of loosening. An additional 2.1 cm suspected loose body is noted about the anteromedial aspect the left glenohumeral joint, however a discrete donor site is not identified Severe degenerative change of the left glenohumeral joint with joint space loss, subchondral sclerosis and osteophytosis. No evidence of calcific tendinitis. Acromioclavicular joint spaces appear preserved. IMPRESSION: 1. No definite acute findings. 2. Severe degenerative change of the left glenohumeral joint. 3. Single cancellous screw is noted about the inferior aspect of the left glenoid with associated perihardware lucency which could be seen in the setting of hardware loosening. Electronically Signed   By: Sandi Mariscal M.D.   On: 01/30/2017 15:02   ASSESSMENT & PLAN:   Problem List Items Addressed This Visit    Osteoarthritis of left shoulder    Status post Bristow procedure in 1975 with now marked significant changes on x-ray.  Intra-articular injection performed today as below.  X-rays do show some evidence of potential loosening however no significant mechanical symptoms at this time suggest against dynamic instability.  Patient has been told previously that he would require total shoulder arthroplasty from the standpoint of surgery going forward he would like to try to  avoid this if possible.  We will have him try to avoid any type of exacerbating activities including heavy weight.  Isometric and eccentric focus of exercise should be emphasized.  +++++++++++++++++++++++++++++++++++++++++++++++++++++++++++++++ PROCEDURE NOTE: THERAPEUTIC EXERCISES (97110) 15 minutes spent for Therapeutic exercises as stated in above notes.  This included exercises focusing on stretching, strengthening, with significant focus on eccentric aspects.   Proper technique shown and discussed handout in great detail with ATC.  All questions were discussed and answered.    ++++++++++++++++++++++++++++++++++++++++++++  PROCEDURE NOTE -  ULTRASOUND GUIDEDINJECTION: left shoulder Images were obtained and interpreted by myself, Teresa Coombs, DO  Images have been saved and stored to PACS system. Images obtained on: GE S7 Ultrasound machine  ULTRASOUND FINDINGS: Severe degenerative changes.  Large halo sign around the long head of the biceps.  Diminutive subscap with hypertrophic supraspinatus.  Normal infraspinatus and teres minor.  DESCRIPTION OF PROCEDURE:  The patient's clinical condition is marked by substantial pain and/or significant functional disability. Other conservative therapy has not provided relief, is contraindicated, or not appropriate. There is a reasonable likelihood that injection will significantly improve the patient's pain and/or functional impairment. After discussing  the risks, benefits and expected outcomes of the injection and all questions were reviewed and answered, the patient wished to undergo the above named procedure. Verbal consent was obtained. The ultrasound was used to identify the target structure and adjacent neurovascular structures. The skin was then prepped in sterile fashion and the target structure was injected under direct visualization using sterile technique as below: PREP: Alcohol, Ethel Chloride APPROACH: Posterior, sterile stopcock  Technique, 21 g 2 " needle INJECTATE: 3 cc 1% lidocaine, 2 cc 0.5% marcaine, 2cc 40mg  DepoMedrol ASPIRATE: N/A DRESSING: Band-Aid   Post procedural instructions including recommending icing and warning signs for infection were reviewed. This procedure was well tolerated and there were no complications.   IMPRESSION: Succesful US Guided Injection           Other Visit Diagnoses    Chronic left shoulder pain    -  Primary   Relevant Orders   [place] LIMITED JOINT SPACE STRUCTURES UP LEFT(NO LINKED CHARGES)   DG Shoulder Left (Completed)   Shoulder arthritis          Follow-up: Return in about 6 weeks (around 03/13/2017).   CMA/ATC served as Education administrator during this visit. History, Physical, and Plan performed by medical provider. Documentation and orders reviewed and attested to.      Teresa Coombs, [place] [facility] Physician

## 2017-01-30 NOTE — Assessment & Plan Note (Addendum)
Status post Bristow procedure in 1975 with now marked significant changes on x-ray.  Intra-articular injection performed today as below.  X-rays do show some evidence of potential loosening however no significant mechanical symptoms at this time suggest against dynamic instability.  Patient has been told previously that he would require total shoulder arthroplasty from the standpoint of surgery going forward he would like to try to avoid this if possible.  We will have him try to avoid any type of exacerbating activities including heavy weight.  Isometric and eccentric focus of exercise should be emphasized.  +++++++++++++++++++++++++++++++++++++++++++++++++++++++++++++++ PROCEDURE NOTE: THERAPEUTIC EXERCISES (97110) 15 minutes spent for Therapeutic exercises as stated in above notes.  This included exercises focusing on stretching, strengthening, with significant focus on eccentric aspects.   Proper technique shown and discussed handout in great detail with ATC.  All questions were discussed and answered.    ++++++++++++++++++++++++++++++++++++++++++++  PROCEDURE NOTE -  ULTRASOUND GUIDEDINJECTION: left shoulder Images were obtained and interpreted by myself, Teresa Coombs, DO  Images have been saved and stored to PACS system. Images obtained on: GE S7 Ultrasound machine  ULTRASOUND FINDINGS: Severe degenerative changes.  Large halo sign around the long head of the biceps.  Diminutive subscap with hypertrophic supraspinatus.  Normal infraspinatus and teres minor.  DESCRIPTION OF PROCEDURE:  The patient's clinical condition is marked by substantial pain and/or significant functional disability. Other conservative therapy has not provided relief, is contraindicated, or not appropriate. There is a reasonable likelihood that injection will significantly improve the patient's pain and/or functional impairment. After discussing the risks, benefits and expected outcomes of the injection and all  questions were reviewed and answered, the patient wished to undergo the above named procedure. Verbal consent was obtained. The ultrasound was used to identify the target structure and adjacent neurovascular structures. The skin was then prepped in sterile fashion and the target structure was injected under direct visualization using sterile technique as below: PREP: Alcohol, Ethel Chloride APPROACH: Posterior, sterile stopcock Technique, 21 g 2 " needle INJECTATE: 3 cc 1% lidocaine, 2 cc 0.5% marcaine, 2cc 40mg  DepoMedrol ASPIRATE: N/A DRESSING: Band-Aid   Post procedural instructions including recommending icing and warning signs for infection were reviewed. This procedure was well tolerated and there were no complications.   IMPRESSION: Succesful US Guided Injection

## 2017-01-31 ENCOUNTER — Telehealth: Payer: Self-pay

## 2017-01-31 ENCOUNTER — Ambulatory Visit: Payer: Medicare Other | Admitting: Sports Medicine

## 2017-01-31 NOTE — Telephone Encounter (Signed)
Calling patient because he was seen in the office yesterday for shoulder pain and is on the schedule again today.   Pt reports that he went out to play golf today and he had no relief of the pain. He feels like there may be something more significant going on since he is still having pain. He says that Dr. Paulla Fore told him that it would be OK to play golf today. He iced the shoulder 4-5 times yesterday, took some Ibuprofen and uses topical Voltaren gel. He says that when he has to take a divot there is still pain there. He says that this was one of the reasons that he had surgery in 1975 so he has a lot of concerns.   He would like a call back advising if he needs to come back in today or if Dr. Paulla Fore can just give him a call today.

## 2017-01-31 NOTE — Telephone Encounter (Signed)
I did call and discuss this personally with the patient who reports that he is having persistent left shoulder pain after receiving an injection yesterday.  He went to try to golf and only was able to 15 shots before he had to stop due to the discomfort in the anterior shoulder.  We discussed that he should continue to avoid any type of swinging over the next 5-6 days and I am okay with him resuming activities when he returns from his upcoming trip to [place] to celebrate his 50th anniversary.  If he is having persistent pain and swelling after this would need to consider possible loosening of the coracoid transfer.

## 2017-02-07 ENCOUNTER — Encounter: Payer: Self-pay | Admitting: Sports Medicine

## 2017-02-07 DIAGNOSIS — M19112 Post-traumatic osteoarthritis, left shoulder: Secondary | ICD-10-CM

## 2017-02-08 ENCOUNTER — Other Ambulatory Visit: Payer: Self-pay | Admitting: Sports Medicine

## 2017-02-08 DIAGNOSIS — M19112 Post-traumatic osteoarthritis, left shoulder: Secondary | ICD-10-CM

## 2017-02-08 NOTE — Telephone Encounter (Signed)
Called and spoke with the patient.  He is having persistent anterior pain that is significantly worsening.  There is question of possible hardware loosening on plain film x-rays.  We will proceed with thin slice CT with 3D reconstruction for possible total shoulder arthroplasty planning.

## 2017-02-09 ENCOUNTER — Ambulatory Visit
Admission: RE | Admit: 2017-02-09 | Discharge: 2017-02-09 | Disposition: A | Discharge status: Home / Self Care | Payer: Medicare Other | Source: Ambulatory Visit | Attending: Sports Medicine | Admitting: Sports Medicine

## 2017-02-09 DIAGNOSIS — M19112 Post-traumatic osteoarthritis, left shoulder: Secondary | ICD-10-CM

## 2017-02-10 ENCOUNTER — Ambulatory Visit (INDEPENDENT_AMBULATORY_CARE_PROVIDER_SITE_OTHER): Payer: Medicare Other | Admitting: Sports Medicine

## 2017-02-10 ENCOUNTER — Encounter: Payer: Self-pay | Admitting: Sports Medicine

## 2017-02-10 VITALS — BP 104/76 | HR 69 | Ht 74.0 in | Wt 192.0 lb

## 2017-02-10 DIAGNOSIS — M19019 Primary osteoarthritis, unspecified shoulder: Secondary | ICD-10-CM

## 2017-02-10 DIAGNOSIS — M25512 Pain in left shoulder: Secondary | ICD-10-CM | POA: Diagnosis not present

## 2017-02-10 DIAGNOSIS — G8929 Other chronic pain: Secondary | ICD-10-CM

## 2017-02-10 DIAGNOSIS — M19112 Post-traumatic osteoarthritis, left shoulder: Secondary | ICD-10-CM | POA: Diagnosis not present

## 2017-02-10 NOTE — Progress Notes (Signed)
OFFICE VISIT NOTE Greg Santiago. Greg Santiago, [place] [facility] [place]  Ardit Danh - 71 y.o. male MRN [id number]  Date of birth: 10/13/1945  Visit Date: 02/10/2017  PCP: Lavone Orn, MD   Referred by: Lavone Orn, MD  Burlene Arnt, CMA acting as scribe for Dr. Paulla Fore.  SUBJECTIVE:   Chief Complaint  Patient presents with  . Follow-up  . Review CT of Left Shoulder   HPI: As below and per problem based documentation when appropriate.  Greg Santiago is here today to discuss the results of his recent CT scan.  He has continued to have some pain with shoulder is avoided full golfing at this time.   Pt reports that he has noticed a little improvement with his shoulder pain. The Body Helix seems to be helping. He has been doing his home exercises as directed. He has also been icing hs shoulder. He hasn't returned to his normal golf swing because he doesn't want to over due it, he is giving it a rest.     Review of Systems  Constitutional: Negative for chills and fever.  Respiratory: Negative for shortness of breath and wheezing.   Cardiovascular: Negative for chest pain and palpitations.  Musculoskeletal: Negative for falls.  Neurological: Negative for dizziness, tingling and headaches.  Endo/Heme/Allergies: Does not bruise/bleed easily.    Otherwise per HPI.  HISTORY & PERTINENT PRIOR DATA:  No specialty comments available. He reports that he has never smoked. He has never used smokeless tobacco. No results for input(s): HGBA1C, LABURIC in the last 8760 hours. Medications & Allergies reviewed per EMR Patient Active Problem List   Diagnosis Date Noted  . Post-traumatic osteoarthritis of left shoulder 02/10/2017  . Osteoarthritis of left shoulder 01/30/2017  . Primary osteoarthritis of left knee 12/21/2016  . Chronic pain of left knee 12/21/2016  . Muscle strain of left gluteal region 10/27/2016  . Polyarthralgia 08/19/2014    . Benign neoplasm of skin of trunk, left lower back 12/18/2012   Past Medical History:  Diagnosis Date  . Hypothyroidism    Family History  Problem Relation Age of Onset  . Diabetes Father    Past Surgical History:  Procedure Laterality Date  . left shoulder surgery Left   . THYROIDECTOMY, PARTIAL N/A    Social History   Occupational History  . retired    Social History Main Topics  . Smoking status: Never Smoker  . Smokeless tobacco: Never Used  . Alcohol use No  . Drug use: No  . Sexual activity: Not on file    OBJECTIVE:  VS:  HT:6\' 2"  (188 cm)   WT:192 lb (87.1 kg)  BMI:24.7    BP:104/76  HR:69bpm  TEMP: ( )  RESP:97 % EXAM: Findings:  Left shoulder is overall well aligned, well-defined musculature.  He has pain across the anterior aspect of the shoulder with crepitation and grinding.  Overhead range of motion is limited.  His internal rotation and external rotation strength at 30 is 5+/5.     Ct Shoulder Left Wo Contrast  Result Date: 02/10/2017 CLINICAL DATA:  Posttraumatic osteoarthritis of the left shoulder. Pain with golfing. Nonspecific (abnormal) findings on radiological and other examination of musculoskeletal system. EXAM: CT OF THE UPPER LEFT EXTREMITY WITHOUT CONTRAST CT - 3-DIMENSIONAL CT IMAGE RENDERING ON INDEPENDENT WORKSTATION TECHNIQUE: Multidetector CT imaging of the upper left extremity was performed according to the standard protocol. CT - 3D CT image rendering on independent  workstation COMPARISON:  None. FINDINGS: Bones/Joint/Cartilage No fracture or dislocation. Normal alignment. Prior anterior inferior glenoid repair. Single screw transfixing the anterior inferior fracture of the glenoid has backed out with a 2.2 x 2.2 cm ununited fracture fragment anteriorly displaced by 2.1 cm. Severe osteoarthritis of the glenohumeral joint with severe joint space narrowing and marginal osteophytosis. Subchondral sclerosis and cystic changes in the glenoid.  Small joint effusion. 2 cm loose body in the subcoracoid recess. Mild arthropathy of the acromioclavicular joint. Type I acromion. No significant subacromial/subdeltoid bursal fluid. Partially visualize is degenerative disc disease with disc height loss at C5-6 and C6-7. Ligaments Ligaments are suboptimally evaluated by CT. Muscles and Tendons Muscles are normal.  No muscle atrophy. Soft tissue No fluid collection or hematoma.  No soft tissue mass. IMPRESSION: 1. Severe advanced osteoarthritis of the left glenohumeral joint. 2. Prior anterior inferior glenoid repair. Single screw transfixing the anterior inferior fracture of the glenoid has backed out with a 2.2 x 2.2 cm ununited fracture fragment anteriorly displaced by 2.1 cm. Electronically Signed   By: Kathreen Devoid   On: 02/10/2017 08:13   Ct 3d Independent Darreld Mclean  Result Date: 02/10/2017 CLINICAL DATA:  Posttraumatic osteoarthritis of the left shoulder. Pain with golfing. Nonspecific (abnormal) findings on radiological and other examination of musculoskeletal system. EXAM: CT OF THE UPPER LEFT EXTREMITY WITHOUT CONTRAST CT - 3-DIMENSIONAL CT IMAGE RENDERING ON INDEPENDENT WORKSTATION TECHNIQUE: Multidetector CT imaging of the upper left extremity was performed according to the standard protocol. CT - 3D CT image rendering on independent workstation COMPARISON:  None. FINDINGS: Bones/Joint/Cartilage No fracture or dislocation. Normal alignment. Prior anterior inferior glenoid repair. Single screw transfixing the anterior inferior fracture of the glenoid has backed out with a 2.2 x 2.2 cm ununited fracture fragment anteriorly displaced by 2.1 cm. Severe osteoarthritis of the glenohumeral joint with severe joint space narrowing and marginal osteophytosis. Subchondral sclerosis and cystic changes in the glenoid. Small joint effusion. 2 cm loose body in the subcoracoid recess. Mild arthropathy of the acromioclavicular joint. Type I acromion. No significant  subacromial/subdeltoid bursal fluid. Partially visualize is degenerative disc disease with disc height loss at C5-6 and C6-7. Ligaments Ligaments are suboptimally evaluated by CT. Muscles and Tendons Muscles are normal.  No muscle atrophy. Soft tissue No fluid collection or hematoma.  No soft tissue mass. IMPRESSION: 1. Severe advanced osteoarthritis of the left glenohumeral joint. 2. Prior anterior inferior glenoid repair. Single screw transfixing the anterior inferior fracture of the glenoid has backed out with a 2.2 x 2.2 cm ununited fracture fragment anteriorly displaced by 2.1 cm. Electronically Signed   By: Kathreen Devoid   On: 02/10/2017 08:13   Dg Shoulder Left  Result Date: 01/30/2017 CLINICAL DATA:  Chronic shoulder pain. Remote history of shoulder surgery. EXAM: LEFT SHOULDER - 2+ VIEW COMPARISON:  Left shoulder MRI - 06/05/2012 FINDINGS: No fracture or dislocation. Single cancellous screws noted involving the inferior aspect the left glenoid. Lucency surrounds the cancellous screw potentially indicative of loosening. An additional 2.1 cm suspected loose body is noted about the anteromedial aspect the left glenohumeral joint, however a discrete donor site is not identified Severe degenerative change of the left glenohumeral joint with joint space loss, subchondral sclerosis and osteophytosis. No evidence of calcific tendinitis. Acromioclavicular joint spaces appear preserved. IMPRESSION: 1. No definite acute findings. 2. Severe degenerative change of the left glenohumeral joint. 3. Single cancellous screw is noted about the inferior aspect of the left glenoid with associated perihardware lucency which  could be seen in the setting of hardware loosening. Electronically Signed   By: Sandi Mariscal M.D.   On: 01/30/2017 15:02   ASSESSMENT & PLAN:   Problem List Items Addressed This Visit    Osteoarthritis of left shoulder    Severe degenerative changes on his CT with nonunion of the posterior repair and  anterior displacement and loosening of the screw is likely contributing to the worsening anterior symptoms.  Given the severe degenerative changes discussion around total shoulder arthroplasty versus continued conservative care was discussed.  There is a 50% of this 35 minute visit was spent in direct patient counseling and coordination of care.  Ultimately findings do not prohibit him from total shoulder although he does have some anterior glenoid erosion.  He will continue with modified activities and minimize overhead activity.  Isometric strengthening exercises discussed.         Other Visit Diagnoses    Chronic left shoulder pain    -  Primary   Shoulder arthritis          Follow-up: Return if symptoms worsen or fail to improve.   CMA/ATC served as Education administrator during this visit. History, Physical, and Plan performed by medical provider. Documentation and orders reviewed and attested to.      Teresa Coombs, [place] [facility] Sports [facility] Physician

## 2017-02-10 NOTE — Patient Instructions (Addendum)
It was good to see you.  Work on Personnel officer. Scapular Stabilization will need to be emphasized.    You can avoid anterior exercises such as bench press on a regular basis but it is okay to perform these without pain intermittently.

## 2017-02-13 ENCOUNTER — Encounter: Payer: Self-pay | Admitting: Sports Medicine

## 2017-02-14 DIAGNOSIS — L308 Other specified dermatitis: Secondary | ICD-10-CM | POA: Diagnosis not present

## 2017-02-14 DIAGNOSIS — D239 Other benign neoplasm of skin, unspecified: Secondary | ICD-10-CM | POA: Diagnosis not present

## 2017-02-14 DIAGNOSIS — L578 Other skin changes due to chronic exposure to nonionizing radiation: Secondary | ICD-10-CM | POA: Diagnosis not present

## 2017-02-14 DIAGNOSIS — L820 Inflamed seborrheic keratosis: Secondary | ICD-10-CM | POA: Diagnosis not present

## 2017-02-14 DIAGNOSIS — L821 Other seborrheic keratosis: Secondary | ICD-10-CM | POA: Diagnosis not present

## 2017-02-14 DIAGNOSIS — L814 Other melanin hyperpigmentation: Secondary | ICD-10-CM | POA: Diagnosis not present

## 2017-02-15 NOTE — Telephone Encounter (Signed)
Patient is requesting a call today from Dr. Paulla Fore about the e-mail in the note from mychart. Patient has not heard back from anyone. If Dr. Paulla Fore will not be able to call patient, he is requesting that the nurse call him to advise [phone].

## 2017-02-27 NOTE — Assessment & Plan Note (Signed)
Severe degenerative changes on his CT with nonunion of the posterior repair and anterior displacement and loosening of the screw is likely contributing to the worsening anterior symptoms.  Given the severe degenerative changes discussion around total shoulder arthroplasty versus continued conservative care was discussed.  There is a 50% of this 35 minute visit was spent in direct patient counseling and coordination of care.  Ultimately findings do not prohibit him from total shoulder although he does have some anterior glenoid erosion.  He will continue with modified activities and minimize overhead activity.  Isometric strengthening exercises discussed.

## 2017-03-02 ENCOUNTER — Ambulatory Visit (INDEPENDENT_AMBULATORY_CARE_PROVIDER_SITE_OTHER): Payer: Medicare Other | Admitting: Sports Medicine

## 2017-03-02 ENCOUNTER — Telehealth: Payer: Self-pay | Admitting: Sports Medicine

## 2017-03-02 ENCOUNTER — Encounter: Payer: Self-pay | Admitting: Sports Medicine

## 2017-03-02 VITALS — BP 100/80 | HR 67 | Ht 74.0 in | Wt 191.4 lb

## 2017-03-02 DIAGNOSIS — G8929 Other chronic pain: Secondary | ICD-10-CM

## 2017-03-02 DIAGNOSIS — M19112 Post-traumatic osteoarthritis, left shoulder: Secondary | ICD-10-CM | POA: Diagnosis not present

## 2017-03-02 DIAGNOSIS — M4722 Other spondylosis with radiculopathy, cervical region: Secondary | ICD-10-CM

## 2017-03-02 DIAGNOSIS — M25512 Pain in left shoulder: Secondary | ICD-10-CM

## 2017-03-02 DIAGNOSIS — M25511 Pain in right shoulder: Secondary | ICD-10-CM

## 2017-03-02 DIAGNOSIS — M19019 Primary osteoarthritis, unspecified shoulder: Secondary | ICD-10-CM

## 2017-03-02 MED ORDER — AMITRIPTYLINE HCL 25 MG PO TABS: 25.0000 mg | ORAL_TABLET | Freq: Every day | ORAL | 3 refills | Status: DC

## 2017-03-02 NOTE — Telephone Encounter (Signed)
Patient was told Dr. Paulla Fore would prescribe him a z-pack but Dr. Paulla Fore possibly forgot.  Patient wants to make sure prior authorization for amitriptyline is being taken care of asap.  Thank you,  -LL

## 2017-03-02 NOTE — Progress Notes (Signed)
OFFICE VISIT NOTE Greg Santiago, [place] [facility] [place] [facility]  Greg Santiago - 71 y.o. male MRN [id number]  Date of birth: [birth year]  Visit Date: 03/02/2017  PCP: Greg Orn, MD   Referred by: Greg Orn, MD  Greg Santiago, cma acting as scribe for Dr. Paulla Santiago.  SUBJECTIVE:   Chief Complaint  Patient presents with  . Follow-up  . Left Shoulder Blade Pain   HPI: As below and per problem based documentation when appropriate.   Greg Santiago reports reccurent pain in the mornings around the LT shoulder blade. Sx typically last until approx mid day and describes this as a dull tooth ache. No new injuries. Tried massages, heat therapy, and went to a chiropractor with minimal relief. Pain is localized. No medication therapy for sx.  Previously seen for LT shoulder pain. He did receive an injection 2 visits ago which has given him relief. He reports having greater range of motion.     Review of Systems  Constitutional: Negative for chills, diaphoresis, fever, malaise/fatigue and weight loss.  HENT: Negative.   Eyes: Negative.   Respiratory: Negative.   Cardiovascular: Negative.   Gastrointestinal: Negative.   Genitourinary: Negative.   Musculoskeletal: Positive for joint pain and myalgias. Negative for back pain, falls and neck pain.  Skin: Negative.   Neurological: Negative.  Negative for weakness.  Endo/Heme/Allergies: Negative for environmental allergies and polydipsia. Does not bruise/bleed easily.  Psychiatric/Behavioral: Negative.     Otherwise per HPI.  HISTORY & PERTINENT PRIOR DATA:  No specialty comments available. He reports that he has never smoked. He has never used smokeless tobacco. No results for input(s): HGBA1C, LABURIC in the last 8760 hours. Medications & Allergies reviewed per EMR Patient Active Problem List   Diagnosis Date Noted  . Post-traumatic osteoarthritis of left shoulder 02/10/2017  .  Osteoarthritis of left shoulder 01/30/2017  . Primary osteoarthritis of left knee 12/21/2016  . Chronic pain of left knee 12/21/2016  . Muscle strain of left gluteal region 10/27/2016  . Polyarthralgia 08/19/2014  . Benign neoplasm of skin of trunk, left lower back 12/18/2012   Past Medical History:  Diagnosis Date  . Hypothyroidism    Family History  Problem Relation Age of Onset  . Diabetes Father    Past Surgical History:  Procedure Laterality Date  . left shoulder surgery Left   . THYROIDECTOMY, PARTIAL N/A    Social History   Occupational History  . retired    Social History Main Topics  . Smoking status: Never Smoker  . Smokeless tobacco: Never Used  . Alcohol use No  . Drug use: No  . Sexual activity: Not on file    OBJECTIVE:  VS:  HT:6\' 2"  (188 cm)   WT:191 lb 6.4 oz (86.8 kg)  BMI:24.6    BP:100/80  HR:67bpm  TEMP: ( )  RESP:98 % EXAM: Findings:  Left shoulder has pain with axial loading and circumduction with a small amount of crepitation.  Only small amount of pain over the anterior shoulder.  Upper extremity sensation is intact to light touch and no significant pain in the brachial plexus squeeze or arm squeeze test.  His intrinsic shoulder strength is intact and quite strong but dynamically he has pain.  He has a negative Spurling's compression test but does have significant limitations of cervical sidebending and rotation bilaterally.     No results found. ASSESSMENT & PLAN:     ICD-10-CM   1.  Chronic left shoulder pain M25.512    G89.29   2. Osteoarthritis of spine with radiculopathy, cervical region M47.22   3. Shoulder arthritis M19.019   4. Chronic periscapular pain on right side M25.511    G89.29   5. Post-traumatic osteoarthritis of left shoulder M19.112   ================================================================= Post-traumatic osteoarthritis of left shoulder >50% of this 25 minute visit spent in direct patient counseling and/or  coordination of care.  Discussion was focused on education regarding the in discussing the pathoetiology and anticipated clinical course of the above condition.  Ultimately he has been having good improvement pain since the time of his injection and reports initially slow but now steady improvement.  He does report feeling as though he is ready to start playing golf again and has been swinging the club with no significant exacerbation of his pain.  Discussed the use of compression as well as possible biologic therapy with PRP although we did discuss the lack of evidence for this condition.  This time we will plan to allow him to begin to return to activities as tolerated and he will call us I he is interested in pursuing PRP.  Continue with focus on isometric exercises  ================================================================= Patient Instructions  We can consider setting you up to perform a PRP (platelet rich plasma) injection for your elbow.  The cost of this is $495 out of pocket.  We will plan to have you come back next week and perform this at your convenience.  Please stop taking any ibuprofen or Aleve at this time.  It is okay to take Tylenol if needed.  Please go ahead and fill your tramadol prescription so you have it available for after the procedure.   ================================================================= No future appointments.  Follow-up: Return if symptoms worsen or fail to improve.   CMA/ATC served as Education administrator during this visit. History, Physical, and Plan performed by medical provider. Documentation and orders reviewed and attested to.      Greg Santiago, [facility] Physician

## 2017-03-02 NOTE — Telephone Encounter (Signed)
[name] at [facility] called states pt ins is requiring prior auth for amitriptyline . Please call ins help desk [phone]. [name] at [place].

## 2017-03-02 NOTE — Patient Instructions (Signed)
We can consider setting you up to perform a PRP (platelet rich plasma) injection for your elbow.  The cost of this is $495 out of pocket.  We will plan to have you come back next week and perform this at your convenience.  Please stop taking any ibuprofen or Aleve at this time.  It is okay to take Tylenol if needed.  Please go ahead and fill your tramadol prescription so you have it available for after the procedure.

## 2017-03-03 ENCOUNTER — Other Ambulatory Visit: Payer: Self-pay

## 2017-03-03 MED ORDER — AZITHROMYCIN 250 MG PO TABS: ORAL_TABLET | ORAL | 0 refills | Status: DC

## 2017-03-03 NOTE — Addendum Note (Signed)
Addended by: Teresa Coombs D on: 03/03/2017 10:02 AM   Modules accepted: Orders

## 2017-03-03 NOTE — Telephone Encounter (Signed)
Called pt and left VM to call the office.  

## 2017-03-03 NOTE — Telephone Encounter (Signed)
Prior auth in progress. Forwarding to Dr. Paulla Fore regarding z-pak

## 2017-03-03 NOTE — Telephone Encounter (Signed)
Spoke with patient, he paid out of pocket for rx, $7.00. Z-pak sent to [facility] per pt request.

## 2017-03-07 DIAGNOSIS — M9903 Segmental and somatic dysfunction of lumbar region: Secondary | ICD-10-CM | POA: Diagnosis not present

## 2017-03-07 DIAGNOSIS — M4716 Other spondylosis with myelopathy, lumbar region: Secondary | ICD-10-CM | POA: Diagnosis not present

## 2017-03-07 DIAGNOSIS — M9902 Segmental and somatic dysfunction of thoracic region: Secondary | ICD-10-CM | POA: Diagnosis not present

## 2017-03-07 DIAGNOSIS — M4604 Spinal enthesopathy, thoracic region: Secondary | ICD-10-CM | POA: Diagnosis not present

## 2017-03-08 DIAGNOSIS — D1801 Hemangioma of skin and subcutaneous tissue: Secondary | ICD-10-CM | POA: Diagnosis not present

## 2017-03-09 DIAGNOSIS — M9903 Segmental and somatic dysfunction of lumbar region: Secondary | ICD-10-CM | POA: Diagnosis not present

## 2017-03-09 DIAGNOSIS — M4716 Other spondylosis with myelopathy, lumbar region: Secondary | ICD-10-CM | POA: Diagnosis not present

## 2017-03-09 DIAGNOSIS — M4604 Spinal enthesopathy, thoracic region: Secondary | ICD-10-CM | POA: Diagnosis not present

## 2017-03-09 DIAGNOSIS — M9902 Segmental and somatic dysfunction of thoracic region: Secondary | ICD-10-CM | POA: Diagnosis not present

## 2017-03-13 ENCOUNTER — Ambulatory Visit: Payer: Medicare Other | Admitting: Sports Medicine

## 2017-03-14 DIAGNOSIS — M4716 Other spondylosis with myelopathy, lumbar region: Secondary | ICD-10-CM | POA: Diagnosis not present

## 2017-03-14 DIAGNOSIS — M4604 Spinal enthesopathy, thoracic region: Secondary | ICD-10-CM | POA: Diagnosis not present

## 2017-03-14 DIAGNOSIS — M9903 Segmental and somatic dysfunction of lumbar region: Secondary | ICD-10-CM | POA: Diagnosis not present

## 2017-03-14 DIAGNOSIS — M9902 Segmental and somatic dysfunction of thoracic region: Secondary | ICD-10-CM | POA: Diagnosis not present

## 2017-03-16 DIAGNOSIS — M4604 Spinal enthesopathy, thoracic region: Secondary | ICD-10-CM | POA: Diagnosis not present

## 2017-03-16 DIAGNOSIS — M9903 Segmental and somatic dysfunction of lumbar region: Secondary | ICD-10-CM | POA: Diagnosis not present

## 2017-03-16 DIAGNOSIS — M4716 Other spondylosis with myelopathy, lumbar region: Secondary | ICD-10-CM | POA: Diagnosis not present

## 2017-03-16 DIAGNOSIS — M9902 Segmental and somatic dysfunction of thoracic region: Secondary | ICD-10-CM | POA: Diagnosis not present

## 2017-03-20 DIAGNOSIS — M9903 Segmental and somatic dysfunction of lumbar region: Secondary | ICD-10-CM | POA: Diagnosis not present

## 2017-03-20 DIAGNOSIS — M4604 Spinal enthesopathy, thoracic region: Secondary | ICD-10-CM | POA: Diagnosis not present

## 2017-03-20 DIAGNOSIS — M4716 Other spondylosis with myelopathy, lumbar region: Secondary | ICD-10-CM | POA: Diagnosis not present

## 2017-03-20 DIAGNOSIS — M9902 Segmental and somatic dysfunction of thoracic region: Secondary | ICD-10-CM | POA: Diagnosis not present

## 2017-04-06 DIAGNOSIS — M9903 Segmental and somatic dysfunction of lumbar region: Secondary | ICD-10-CM | POA: Diagnosis not present

## 2017-04-06 DIAGNOSIS — M9902 Segmental and somatic dysfunction of thoracic region: Secondary | ICD-10-CM | POA: Diagnosis not present

## 2017-04-06 DIAGNOSIS — M4716 Other spondylosis with myelopathy, lumbar region: Secondary | ICD-10-CM | POA: Diagnosis not present

## 2017-04-06 DIAGNOSIS — M4604 Spinal enthesopathy, thoracic region: Secondary | ICD-10-CM | POA: Diagnosis not present

## 2017-04-11 NOTE — Assessment & Plan Note (Signed)
>  50% of this 25 minute visit spent in direct patient counseling and/or coordination of care.  Discussion was focused on education regarding the in discussing the pathoetiology and anticipated clinical course of the above condition.  Ultimately he has been having good improvement pain since the time of his injection and reports initially slow but now steady improvement.  He does report feeling as though he is ready to start playing golf again and has been swinging the club with no significant exacerbation of his pain.  Discussed the use of compression as well as possible biologic therapy with PRP although we did discuss the lack of evidence for this condition.  This time we will plan to allow him to begin to return to activities as tolerated and he will call us I he is interested in pursuing PRP.  Continue with focus on isometric exercises

## 2017-04-12 DIAGNOSIS — M4604 Spinal enthesopathy, thoracic region: Secondary | ICD-10-CM | POA: Diagnosis not present

## 2017-04-12 DIAGNOSIS — M9902 Segmental and somatic dysfunction of thoracic region: Secondary | ICD-10-CM | POA: Diagnosis not present

## 2017-04-12 DIAGNOSIS — M4716 Other spondylosis with myelopathy, lumbar region: Secondary | ICD-10-CM | POA: Diagnosis not present

## 2017-04-12 DIAGNOSIS — M9903 Segmental and somatic dysfunction of lumbar region: Secondary | ICD-10-CM | POA: Diagnosis not present

## 2017-04-19 DIAGNOSIS — M4716 Other spondylosis with myelopathy, lumbar region: Secondary | ICD-10-CM | POA: Diagnosis not present

## 2017-04-19 DIAGNOSIS — M9902 Segmental and somatic dysfunction of thoracic region: Secondary | ICD-10-CM | POA: Diagnosis not present

## 2017-04-19 DIAGNOSIS — M9903 Segmental and somatic dysfunction of lumbar region: Secondary | ICD-10-CM | POA: Diagnosis not present

## 2017-04-19 DIAGNOSIS — M4604 Spinal enthesopathy, thoracic region: Secondary | ICD-10-CM | POA: Diagnosis not present

## 2017-05-03 ENCOUNTER — Other Ambulatory Visit: Payer: Self-pay | Admitting: Dermatology

## 2017-05-03 DIAGNOSIS — L570 Actinic keratosis: Secondary | ICD-10-CM | POA: Diagnosis not present

## 2017-05-03 DIAGNOSIS — M4604 Spinal enthesopathy, thoracic region: Secondary | ICD-10-CM | POA: Diagnosis not present

## 2017-05-03 DIAGNOSIS — M4716 Other spondylosis with myelopathy, lumbar region: Secondary | ICD-10-CM | POA: Diagnosis not present

## 2017-05-03 DIAGNOSIS — M9902 Segmental and somatic dysfunction of thoracic region: Secondary | ICD-10-CM | POA: Diagnosis not present

## 2017-05-03 DIAGNOSIS — M9903 Segmental and somatic dysfunction of lumbar region: Secondary | ICD-10-CM | POA: Diagnosis not present

## 2017-05-17 DIAGNOSIS — M9902 Segmental and somatic dysfunction of thoracic region: Secondary | ICD-10-CM | POA: Diagnosis not present

## 2017-05-17 DIAGNOSIS — M4604 Spinal enthesopathy, thoracic region: Secondary | ICD-10-CM | POA: Diagnosis not present

## 2017-05-17 DIAGNOSIS — M9903 Segmental and somatic dysfunction of lumbar region: Secondary | ICD-10-CM | POA: Diagnosis not present

## 2017-05-17 DIAGNOSIS — M9905 Segmental and somatic dysfunction of pelvic region: Secondary | ICD-10-CM | POA: Diagnosis not present

## 2017-05-23 DIAGNOSIS — R070 Pain in throat: Secondary | ICD-10-CM | POA: Diagnosis not present

## 2017-05-23 DIAGNOSIS — H608X3 Other otitis externa, bilateral: Secondary | ICD-10-CM | POA: Diagnosis not present

## 2017-05-23 DIAGNOSIS — R1312 Dysphagia, oropharyngeal phase: Secondary | ICD-10-CM | POA: Diagnosis not present

## 2017-05-31 DIAGNOSIS — M4604 Spinal enthesopathy, thoracic region: Secondary | ICD-10-CM | POA: Diagnosis not present

## 2017-05-31 DIAGNOSIS — M9905 Segmental and somatic dysfunction of pelvic region: Secondary | ICD-10-CM | POA: Diagnosis not present

## 2017-05-31 DIAGNOSIS — M9902 Segmental and somatic dysfunction of thoracic region: Secondary | ICD-10-CM | POA: Diagnosis not present

## 2017-05-31 DIAGNOSIS — M9903 Segmental and somatic dysfunction of lumbar region: Secondary | ICD-10-CM | POA: Diagnosis not present

## 2017-06-14 DIAGNOSIS — M4604 Spinal enthesopathy, thoracic region: Secondary | ICD-10-CM | POA: Diagnosis not present

## 2017-06-14 DIAGNOSIS — M9903 Segmental and somatic dysfunction of lumbar region: Secondary | ICD-10-CM | POA: Diagnosis not present

## 2017-06-14 DIAGNOSIS — M9902 Segmental and somatic dysfunction of thoracic region: Secondary | ICD-10-CM | POA: Diagnosis not present

## 2017-06-14 DIAGNOSIS — M9905 Segmental and somatic dysfunction of pelvic region: Secondary | ICD-10-CM | POA: Diagnosis not present

## 2017-06-28 DIAGNOSIS — M9905 Segmental and somatic dysfunction of pelvic region: Secondary | ICD-10-CM | POA: Diagnosis not present

## 2017-06-28 DIAGNOSIS — M9903 Segmental and somatic dysfunction of lumbar region: Secondary | ICD-10-CM | POA: Diagnosis not present

## 2017-06-28 DIAGNOSIS — M9902 Segmental and somatic dysfunction of thoracic region: Secondary | ICD-10-CM | POA: Diagnosis not present

## 2017-06-28 DIAGNOSIS — M4604 Spinal enthesopathy, thoracic region: Secondary | ICD-10-CM | POA: Diagnosis not present

## 2017-07-06 ENCOUNTER — Ambulatory Visit (INDEPENDENT_AMBULATORY_CARE_PROVIDER_SITE_OTHER): Payer: Medicare Other | Admitting: Sports Medicine

## 2017-07-06 ENCOUNTER — Encounter: Payer: Self-pay | Admitting: Sports Medicine

## 2017-07-06 ENCOUNTER — Ambulatory Visit: Payer: Self-pay

## 2017-07-06 VITALS — BP 102/60 | HR 63 | Ht 74.0 in | Wt 194.4 lb

## 2017-07-06 DIAGNOSIS — M47816 Spondylosis without myelopathy or radiculopathy, lumbar region: Secondary | ICD-10-CM | POA: Diagnosis not present

## 2017-07-06 DIAGNOSIS — G8929 Other chronic pain: Secondary | ICD-10-CM | POA: Diagnosis not present

## 2017-07-06 DIAGNOSIS — M25562 Pain in left knee: Secondary | ICD-10-CM | POA: Diagnosis not present

## 2017-07-06 DIAGNOSIS — M19112 Post-traumatic osteoarthritis, left shoulder: Secondary | ICD-10-CM

## 2017-07-06 NOTE — Patient Instructions (Signed)

## 2017-07-06 NOTE — Progress Notes (Signed)
OFFICE VISIT NOTE Greg Santiago. [place], [place] at [place]  Oakes Mccready - 71 y.o. male MRN [id number]  Date of birth: 1946/06/22  Visit Date: 07/06/2017  PCP: Lavone Orn, MD   Referred by: Lavone Orn, MD  Josepha Pigg, CMA acting as scribe for Dr. Paulla Fore.  SUBJECTIVE:   Chief Complaint  Patient presents with  . Follow-up   HPI: As below and per problem based documentation when appropriate.  Mr. Greg Santiago is an established patient presenting today in follow-up of LT knee pain. He was last seen for knee pain 12/21/16 and was advised to work on core and VMO strengthening. Body Helix compression sleeve was also recommended.   Pt reports that his knee has gradually been getting stiffer. Pain is worse when first getting up after sitting for a prolonged period of time. He doesn't feel like he limps but people have told him that they've noticed him walking with a limp. Pain seems to be mostly on the posterior aspect of the knee. He says that he cannot see any swelling but he can feel swelling. He does ice the knee after golfing and that does seem to help. On occasion he does feel like his knee is catching locking. He does have some aching in the knee after playing golf but rates it as about 3/10, stiffness would be rated about 7/10. At night the pain does seem to radiate into the lateral aspect of the thigh. He is not taking OTC meds for the pain.     Review of Systems  Constitutional: Negative for chills, fever and malaise/fatigue.  Respiratory: Negative for shortness of breath and wheezing.   Cardiovascular: Negative for chest pain, palpitations and leg swelling.  Musculoskeletal: Positive for joint pain.  Neurological: Positive for dizziness. Negative for tingling, weakness and headaches.    Otherwise per HPI.  HISTORY & PERTINENT PRIOR DATA:  Prior History reviewed and updated per electronic medical record. Significant  history, findings, studies and interim changes include: No additional findings.  reports that  has never smoked. he has never used smokeless tobacco. No results for input(s): HGBA1C, LABURIC, CREATINE in the last 8760 hours. Problem  Osteoarthritis of Left Shoulder   S/p Bristow procedure (coracoid transfer) in 1975 Severe changes S/p injection on 01/30/17   Chronic Pain of Left Knee    OBJECTIVE:  VS:  HT:6\' 2"  (188 cm)   WT:194 lb 6.4 oz (88.2 kg)  BMI:24.95    BP:102/60  HR:63bpm  TEMP: ( )  RESP:96 %  PHYSICAL EXAM: Constitutional: WDWN, Non-toxic appearing. Psychiatric: Alert & appropriately interactive. Not depressed or anxious appearing. Respiratory: No increased work of breathing. Trachea Midline Eyes: Pupils are equal. EOM intact without nystagmus. No scleral icterus  Left shoulder overall well aligned.  He has good overhead range of motion.  Somewhat limited overhead reach overall quite smooth.  Grip strength, internal rotation, external rotation, empty can testing strength intact.  Left knee: Overall well aligned no significant is generalized synovitis with very small effusion.  He is ligamentously stable.  Pain with McMurray's.  ASSESSMENT & PLAN:   1. Chronic pain of left knee   2. Post-traumatic osteoarthritis of left shoulder   3. Facet arthropathy, lumbar    Plan:    Osteoarthritis of left shoulder Overall happy with how he is progressing.  Continue to watch this.  Can consider repeat injections  Chronic pain of left knee Injection performed today.  PROCEDURE NOTE -  ULTRASOUND GUIDEDInjection: Left Knee Images were obtained and interpreted by myself, Teresa Coombs, DO  Images have been saved and stored to PACS system. Images obtained on: GE S7 Ultrasound machine  ULTRASOUND FINDINGS:  Generalized synovitis without significant effusion  DESCRIPTION OF PROCEDURE:  The patient's clinical condition is marked by substantial pain and/or significant  functional disability. Other conservative therapy has not provided relief, is contraindicated, or not appropriate. There is a reasonable likelihood that injection will significantly improve the patient's pain and/or functional impairment.  After discussing the risks, benefits and expected outcomes of the injection and all questions were reviewed and answered, the patient wished to undergo the above named procedure. Verbal consent was obtained.  The ultrasound was used to identify the target structure and adjacent neurovascular structures. The skin was then prepped in sterile fashion and the target structure was injected under direct visualization using sterile technique as below:  Left PREP: Alcohol, Ethel Chloride,  APPROACH: superiolateral, single injection, 25g 1.5in. INJECTATE:  2cc 0.5% marcaine, 2cc 40mg /mL DepoMedrol  ASPIRATE: N/A DRESSING: Band-Aid   Post procedural instructions including recommending icing and warning signs for infection were reviewed.  This procedure was well tolerated and there were no complications.   IMPRESSION: Succesful US Guided Injection     ++++++++++++++++++++++++++++++++++++++++++++ Orders:  Orders Placed This Encounter  Procedures  . US GUIDED NEEDLE PLACEMENT(NO LINKED CHARGES)    Meds:  No orders of the defined types were placed in this encounter.   ++++++++++++++++++++++++++++++++++++++++++++ Follow-up: Return in about 10 weeks (around 09/14/2017).   Pertinent documentation may be included in additional procedure notes, imaging studies, problem based documentation and patient instructions. Please see these sections of the encounter for additional information regarding this visit. CMA/ATC served as Education administrator during this visit. History, Physical, and Plan performed by medical provider. Documentation and orders reviewed and attested to.      Gerda Diss, [place] [facility] Physician

## 2017-07-07 ENCOUNTER — Ambulatory Visit: Payer: Medicare Other | Admitting: Sports Medicine

## 2017-07-19 DIAGNOSIS — M9905 Segmental and somatic dysfunction of pelvic region: Secondary | ICD-10-CM | POA: Diagnosis not present

## 2017-07-19 DIAGNOSIS — M9903 Segmental and somatic dysfunction of lumbar region: Secondary | ICD-10-CM | POA: Diagnosis not present

## 2017-07-19 DIAGNOSIS — M4604 Spinal enthesopathy, thoracic region: Secondary | ICD-10-CM | POA: Diagnosis not present

## 2017-07-19 DIAGNOSIS — M9902 Segmental and somatic dysfunction of thoracic region: Secondary | ICD-10-CM | POA: Diagnosis not present

## 2017-07-24 ENCOUNTER — Encounter: Payer: Self-pay | Admitting: Sports Medicine

## 2017-07-24 NOTE — Assessment & Plan Note (Signed)
Overall happy with how he is progressing.  Continue to watch this.  Can consider repeat injections

## 2017-07-24 NOTE — Assessment & Plan Note (Signed)
Injection performed today.  PROCEDURE NOTE -  ULTRASOUND GUIDEDInjection: Left Knee Images were obtained and interpreted by myself, Teresa Coombs, DO  Images have been saved and stored to PACS system. Images obtained on: GE S7 Ultrasound machine  ULTRASOUND FINDINGS:  Generalized synovitis without significant effusion  DESCRIPTION OF PROCEDURE:  The patient's clinical condition is marked by substantial pain and/or significant functional disability. Other conservative therapy has not provided relief, is contraindicated, or not appropriate. There is a reasonable likelihood that injection will significantly improve the patient's pain and/or functional impairment.  After discussing the risks, benefits and expected outcomes of the injection and all questions were reviewed and answered, the patient wished to undergo the above named procedure. Verbal consent was obtained.  The ultrasound was used to identify the target structure and adjacent neurovascular structures. The skin was then prepped in sterile fashion and the target structure was injected under direct visualization using sterile technique as below:  Left PREP: Alcohol, Ethel Chloride,  APPROACH: superiolateral, single injection, 25g 1.5in. INJECTATE:  2cc 0.5% marcaine, 2cc 40mg /mL DepoMedrol  ASPIRATE: N/A DRESSING: Band-Aid   Post procedural instructions including recommending icing and warning signs for infection were reviewed.  This procedure was well tolerated and there were no complications.   IMPRESSION: Succesful US Guided Injection

## 2017-08-02 DIAGNOSIS — M9902 Segmental and somatic dysfunction of thoracic region: Secondary | ICD-10-CM | POA: Diagnosis not present

## 2017-08-02 DIAGNOSIS — M9905 Segmental and somatic dysfunction of pelvic region: Secondary | ICD-10-CM | POA: Diagnosis not present

## 2017-08-02 DIAGNOSIS — M4604 Spinal enthesopathy, thoracic region: Secondary | ICD-10-CM | POA: Diagnosis not present

## 2017-08-02 DIAGNOSIS — M9903 Segmental and somatic dysfunction of lumbar region: Secondary | ICD-10-CM | POA: Diagnosis not present

## 2017-08-23 DIAGNOSIS — M9903 Segmental and somatic dysfunction of lumbar region: Secondary | ICD-10-CM | POA: Diagnosis not present

## 2017-08-23 DIAGNOSIS — M9902 Segmental and somatic dysfunction of thoracic region: Secondary | ICD-10-CM | POA: Diagnosis not present

## 2017-08-23 DIAGNOSIS — M4604 Spinal enthesopathy, thoracic region: Secondary | ICD-10-CM | POA: Diagnosis not present

## 2017-08-23 DIAGNOSIS — M9905 Segmental and somatic dysfunction of pelvic region: Secondary | ICD-10-CM | POA: Diagnosis not present

## 2017-08-29 DIAGNOSIS — L570 Actinic keratosis: Secondary | ICD-10-CM | POA: Diagnosis not present

## 2017-08-29 DIAGNOSIS — L905 Scar conditions and fibrosis of skin: Secondary | ICD-10-CM | POA: Diagnosis not present

## 2017-08-30 DIAGNOSIS — H16223 Keratoconjunctivitis sicca, not specified as Sjogren's, bilateral: Secondary | ICD-10-CM | POA: Diagnosis not present

## 2017-09-06 DIAGNOSIS — M9903 Segmental and somatic dysfunction of lumbar region: Secondary | ICD-10-CM | POA: Diagnosis not present

## 2017-09-06 DIAGNOSIS — M9905 Segmental and somatic dysfunction of pelvic region: Secondary | ICD-10-CM | POA: Diagnosis not present

## 2017-09-06 DIAGNOSIS — M4604 Spinal enthesopathy, thoracic region: Secondary | ICD-10-CM | POA: Diagnosis not present

## 2017-09-06 DIAGNOSIS — M9902 Segmental and somatic dysfunction of thoracic region: Secondary | ICD-10-CM | POA: Diagnosis not present

## 2017-09-11 DIAGNOSIS — M19011 Primary osteoarthritis, right shoulder: Secondary | ICD-10-CM | POA: Diagnosis not present

## 2017-09-11 DIAGNOSIS — E039 Hypothyroidism, unspecified: Secondary | ICD-10-CM | POA: Diagnosis not present

## 2017-09-11 DIAGNOSIS — G8929 Other chronic pain: Secondary | ICD-10-CM | POA: Diagnosis not present

## 2017-09-11 DIAGNOSIS — M19012 Primary osteoarthritis, left shoulder: Secondary | ICD-10-CM | POA: Diagnosis not present

## 2017-09-11 DIAGNOSIS — Z125 Encounter for screening for malignant neoplasm of prostate: Secondary | ICD-10-CM | POA: Diagnosis not present

## 2017-09-11 DIAGNOSIS — Z0000 Encounter for general adult medical examination without abnormal findings: Secondary | ICD-10-CM | POA: Diagnosis not present

## 2017-09-11 DIAGNOSIS — Z1389 Encounter for screening for other disorder: Secondary | ICD-10-CM | POA: Diagnosis not present

## 2017-09-11 DIAGNOSIS — M179 Osteoarthritis of knee, unspecified: Secondary | ICD-10-CM | POA: Diagnosis not present

## 2017-09-11 DIAGNOSIS — M5137 Other intervertebral disc degeneration, lumbosacral region: Secondary | ICD-10-CM | POA: Diagnosis not present

## 2017-09-13 ENCOUNTER — Ambulatory Visit (INDEPENDENT_AMBULATORY_CARE_PROVIDER_SITE_OTHER): Payer: Medicare Other | Admitting: Sports Medicine

## 2017-09-13 ENCOUNTER — Encounter: Payer: Self-pay | Admitting: Sports Medicine

## 2017-09-13 VITALS — BP 122/76 | HR 76 | Ht 74.0 in | Wt 190.8 lb

## 2017-09-13 DIAGNOSIS — M19112 Post-traumatic osteoarthritis, left shoulder: Secondary | ICD-10-CM | POA: Diagnosis not present

## 2017-09-13 DIAGNOSIS — G8929 Other chronic pain: Secondary | ICD-10-CM

## 2017-09-13 DIAGNOSIS — M7660 Achilles tendinitis, unspecified leg: Secondary | ICD-10-CM

## 2017-09-13 DIAGNOSIS — M25562 Pain in left knee: Secondary | ICD-10-CM | POA: Diagnosis not present

## 2017-09-13 DIAGNOSIS — M47816 Spondylosis without myelopathy or radiculopathy, lumbar region: Secondary | ICD-10-CM

## 2017-09-13 NOTE — Patient Instructions (Signed)
Keep using the Spenco orthotics that you have but I would recommend using them at all issues.  If you are having any issues with your knee please follow-up sooner rather than later so we can discuss either repeat injection or Visco supplementation.

## 2017-09-13 NOTE — Progress Notes (Signed)
Greg Santiago. Greg Santiago, [place] at [place]  Greg Santiago - 72 y.o. male MRN [id number]  Date of birth: 10/12/1945  Visit Date: 09/13/2017  PCP: Lavone Orn, MD   Referred by: Lavone Orn, MD   Scribe for today's visit: Frutoso Chase, RT(R)     SUBJECTIVE:  Greg Santiago "Greg Santiago" is here for Follow-up (Left knee) and Ankle Pain (Right ankle and right heel)  Pt here today for follow up on left knee. Pt had prior knee injection in office. Has had a lot of relief from injection. Pt mentioned he would like to postpone synvisc injection since his knee is feeling better. Pt rates left knee pain a 1 out of 10.   Pt also has new complaint of right ankle and right heel pain. Pain is worse after playing golf or distance walking. No known injury. Pt says his ankle gets very stiff. Also notes difficulty in putting weight on heel when pain gets severe. Rates pain as a 6 out of 10 at the worst. After icing pt rates pain as a 1 out of 10.    ROS Denies night time disturbances. Denies fevers, chills, or night sweats. Denies unexplained weight loss. Denies personal history of cancer. Denies changes in bowel or bladder habits. Denies recent unreported falls. Denies new or worsening dyspnea or wheezing. Denies headaches or dizziness.  Denies numbness, tingling or weakness  In the extremities.  Denies dizziness or presyncopal episodes Denies lower extremity edema     HISTORY & PERTINENT PRIOR DATA:  Prior History reviewed and updated per electronic medical record.  Significant history, findings, studies and interim changes include:  reports that  has never smoked. he has never used smokeless tobacco. No results for input(s): HGBA1C, LABURIC, CREATINE in the last 8760 hours. 07/06/17 - Monovisc/Orthovisc 80/20 coverage - BLS Problem  SHOULDER OA - Post-traumatic osteoarthritis of left shoulder   S/p Bristow procedure (coracoid  transfer) in 1975 Severe changes S/p injection on 01/30/17     OBJECTIVE:  VS:  HT:6\' 2"  (188 cm)   WT:190 lb 12.8 oz (86.5 kg)  BMI:24.49    BP:122/76  HR:76bpm  TEMP: ( )  RESP:96 %   PHYSICAL EXAM: Constitutional: WDWN, Non-toxic appearing. Psychiatric: Alert & appropriately interactive.  Not depressed or anxious appearing. Respiratory: No increased work of breathing.  Trachea Midline Eyes: Pupils are equal.  EOM intact without nystagmus.  No scleral icterus  NEUROVASCULAR exam: No clubbing or cyanosis appreciated No significant venous stasis changes Capillary Refill: normal, less than 2 seconds   Left knee is overall well aligned he has improved range of motion with this.  The Achilles is nontender to palpation and he has good range of motion.  Left shoulder is also doing well with full overhead range of motion. No additional findings.   ASSESSMENT & PLAN:   1. Achilles tendon pain   2. Chronic pain of left knee   3. Post-traumatic osteoarthritis of left shoulder   4. Facet arthropathy, lumbar    PLAN:>50% of this 25 minute visit spent in direct patient counseling and/or coordination of care.  Discussion was focused on education regarding the in discussing the pathoetiology and anticipated clinical course of the above conditions.  Ultimately he has responded well and is doing great with the therapeutic exercises as well as with chiropractic manipulation with Dr. Daron Offer.  I do think he would benefit from a piece of therapy for the Achilles as  he does have a small amount of the possible recurrent plantar nerve irritation which is mimicking plantar fasciitis.  Would also benefit from it from the musculotendinous aspect of the Achilles.  The lack of improvement can consider further diagnostic evaluation but at this time will see how he progresses with continued dietary and activity modifications. No problem-specific Assessment & Plan notes found for this  encounter.   ++++++++++++++++++++++++++++++++++++++++++++ Orders & Meds: No orders of the defined types were placed in this encounter.   No orders of the defined types were placed in this encounter.   ++++++++++++++++++++++++++++++++++++++++++++ Follow-up: Return if symptoms worsen or fail to improve.   Pertinent documentation may be included in additional procedure notes, imaging studies, problem based documentation and patient instructions. Please see these sections of the encounter for additional information regarding this visit. CMA/ATC served as Education administrator during this visit. History, Physical, and Plan performed by medical provider. Documentation and orders reviewed and attested to.      Gerda Diss, [place] [facility] Physician

## 2017-09-19 ENCOUNTER — Encounter: Payer: Self-pay | Admitting: Sports Medicine

## 2017-09-20 DIAGNOSIS — M4604 Spinal enthesopathy, thoracic region: Secondary | ICD-10-CM | POA: Diagnosis not present

## 2017-09-20 DIAGNOSIS — M9902 Segmental and somatic dysfunction of thoracic region: Secondary | ICD-10-CM | POA: Diagnosis not present

## 2017-09-20 DIAGNOSIS — M9903 Segmental and somatic dysfunction of lumbar region: Secondary | ICD-10-CM | POA: Diagnosis not present

## 2017-09-20 DIAGNOSIS — M9905 Segmental and somatic dysfunction of pelvic region: Secondary | ICD-10-CM | POA: Diagnosis not present

## 2017-10-16 DIAGNOSIS — M9902 Segmental and somatic dysfunction of thoracic region: Secondary | ICD-10-CM | POA: Diagnosis not present

## 2017-10-16 DIAGNOSIS — M9903 Segmental and somatic dysfunction of lumbar region: Secondary | ICD-10-CM | POA: Diagnosis not present

## 2017-10-16 DIAGNOSIS — M9905 Segmental and somatic dysfunction of pelvic region: Secondary | ICD-10-CM | POA: Diagnosis not present

## 2017-10-16 DIAGNOSIS — M4604 Spinal enthesopathy, thoracic region: Secondary | ICD-10-CM | POA: Diagnosis not present

## 2018-01-10 DIAGNOSIS — M4604 Spinal enthesopathy, thoracic region: Secondary | ICD-10-CM | POA: Diagnosis not present

## 2018-01-10 DIAGNOSIS — M9905 Segmental and somatic dysfunction of pelvic region: Secondary | ICD-10-CM | POA: Diagnosis not present

## 2018-01-10 DIAGNOSIS — M9902 Segmental and somatic dysfunction of thoracic region: Secondary | ICD-10-CM | POA: Diagnosis not present

## 2018-01-10 DIAGNOSIS — M9903 Segmental and somatic dysfunction of lumbar region: Secondary | ICD-10-CM | POA: Diagnosis not present

## 2018-01-16 DIAGNOSIS — M9903 Segmental and somatic dysfunction of lumbar region: Secondary | ICD-10-CM | POA: Diagnosis not present

## 2018-01-16 DIAGNOSIS — M9902 Segmental and somatic dysfunction of thoracic region: Secondary | ICD-10-CM | POA: Diagnosis not present

## 2018-01-16 DIAGNOSIS — M4604 Spinal enthesopathy, thoracic region: Secondary | ICD-10-CM | POA: Diagnosis not present

## 2018-01-16 DIAGNOSIS — M9905 Segmental and somatic dysfunction of pelvic region: Secondary | ICD-10-CM | POA: Diagnosis not present

## 2018-01-22 ENCOUNTER — Ambulatory Visit (INDEPENDENT_AMBULATORY_CARE_PROVIDER_SITE_OTHER): Payer: Medicare Other | Admitting: Sports Medicine

## 2018-01-22 ENCOUNTER — Encounter: Payer: Self-pay | Admitting: Sports Medicine

## 2018-01-22 ENCOUNTER — Ambulatory Visit (INDEPENDENT_AMBULATORY_CARE_PROVIDER_SITE_OTHER): Payer: Medicare Other

## 2018-01-22 VITALS — BP 106/60 | HR 61 | Ht 74.0 in | Wt 186.0 lb

## 2018-01-22 DIAGNOSIS — M1712 Unilateral primary osteoarthritis, left knee: Secondary | ICD-10-CM | POA: Diagnosis not present

## 2018-01-22 DIAGNOSIS — G8929 Other chronic pain: Secondary | ICD-10-CM

## 2018-01-22 DIAGNOSIS — M25562 Pain in left knee: Secondary | ICD-10-CM | POA: Diagnosis not present

## 2018-01-22 NOTE — Progress Notes (Signed)
Greg Santiago. Greg Santiago, Greg Santiago at [name] - 72 y.o. male MRN [id number]  Date of birth: Jul 27, 1946  Visit Date: 01/22/2018  PCP: Lavone Orn, MD   Referred by: Lavone Orn, MD  Scribe for today's visit: Wendy Poet, LAT, ATC     SUBJECTIVE:  Greg Santiago "Greg Santiago" is here for New Patient (Initial Visit) (L knee pain) .   Pt here today for follow up on left knee. Pt had prior knee injection in office. Has had a lot of relief from injection. Pt mentioned he would like to postpone synvisc injection since his knee is feeling better. Pt rates left knee pain a 1 out of 10.   Pt also has new complaint of right ankle and right heel pain. Pain is worse after playing golf or distance walking. No known injury. Pt says his ankle gets very stiff. Also notes difficulty in putting weight on heel when pain gets severe. Rates pain as a 6 out of 10 at the worst. After icing pt rates pain as a 1 out of 10.   01/22/2018: Compared to the last office visit on 09/13/17, his previously described L knee pain symptoms show no change.  He reports that his L knee extension has decreased.  However, he states that he con't to walk 5x/week, goes to the gym and is playing "the best golf of his life."  Reports that he will be going to [place] for a 5 day golf trip at the end of July 2019. Current symptoms are mild & are radiating to the L lateral thigh. He most recently had a L knee injection on 07/06/17 but postponed a Monovisc injection at his last visit.  He has been using a massager to address trigger points along the L lateral quad.  He states that he wants to have a discussion regarding his L knee but is not interested in getting an injection today.  ROS Reports night time disturbances.  Rarely - once/month Denies fevers, chills, or night sweats. Denies unexplained weight loss. Denies personal history of cancer. Reports changes in  bowel or bladder habits.  Recent issue w/ constipation. Denies recent unreported falls. Denies new or worsening dyspnea or wheezing. Denies headaches or dizziness.  Denies numbness, tingling or weakness  In the extremities.  Denies dizziness or presyncopal episodes Denies lower extremity edema    HISTORY & PERTINENT PRIOR DATA:  Prior History reviewed and updated per electronic medical record.  Significant/pertinent history, findings, studies include:  reports that he has never smoked. He has never used smokeless tobacco. No results for input(s): HGBA1C, LABURIC, CREATINE in the last 8760 hours. 01/19/2018 - This is a Medicare [place] and it covers the Medicare Part B co-insurance and 80% of the excess charges. This plan does not cover the Medicare Part B deductible of $185.00 which $185.00 is met  07/06/17 - Monovisc/Orthovisc 80/20 coverage - BLS No problems updated.  OBJECTIVE:  VS:  HT:_0  (188 cm)   WT:186 lb (84.4 kg)  BMI:23.87    BP:106/60  HR:61bpm  TEMP: ( )  RESP:95 %   PHYSICAL EXAM: Constitutional: WDWN, Non-toxic appearing. Psychiatric: Alert & appropriately interactive.  Not depressed or anxious appearing. Respiratory: No increased work of breathing.  Trachea Midline Eyes: Pupils are equal.  EOM intact without nystagmus.  No scleral icterus  Vascular Exam: warm to touch no edema  lower extremity neuro exam: unremarkable  MSK Exam:  Left knee has a slight flexion contracture.  He is ligamentously stable.  Pain with patellar grind and pain with medial and lateral joint line palpation.  No significant effusion.  X-rays reviewed do show significant tricompartmental degenerative changes most notably in the patellofemoral joint.   ASSESSMENT & PLAN:   1. Chronic pain of left knee   2. Primary osteoarthritis of left knee     PLAN:>50% of this 25 minute visit spent in direct patient counseling and/or coordination of care.  Discussion was focused on  education regarding the in discussing the pathoetiology and anticipated clinical course of the above condition.  Ultimately we did discuss Visco supplementation versus Zilretta versus total knee arthroplasty options.  He will plan to come back in approximately 4 weeks before his upcoming trip to [place] for Zilretta injection  Follow-up: Return if symptoms worsen or fail to improve.      Please see additional documentation for Objective, Assessment and Plan sections. Pertinent additional documentation may be included in corresponding procedure notes, imaging studies, problem based documentation and patient instructions. Please see these sections of the encounter for additional information regarding this visit.  CMA/ATC served as Education administrator during this visit. History, Physical, and Plan performed by medical provider. Documentation and orders reviewed and attested to.      Gerda Diss, [place] [facility] Physician

## 2018-01-30 DIAGNOSIS — M9903 Segmental and somatic dysfunction of lumbar region: Secondary | ICD-10-CM | POA: Diagnosis not present

## 2018-01-30 DIAGNOSIS — M9905 Segmental and somatic dysfunction of pelvic region: Secondary | ICD-10-CM | POA: Diagnosis not present

## 2018-01-30 DIAGNOSIS — M4604 Spinal enthesopathy, thoracic region: Secondary | ICD-10-CM | POA: Diagnosis not present

## 2018-01-30 DIAGNOSIS — M9902 Segmental and somatic dysfunction of thoracic region: Secondary | ICD-10-CM | POA: Diagnosis not present

## 2018-02-07 DIAGNOSIS — M9903 Segmental and somatic dysfunction of lumbar region: Secondary | ICD-10-CM | POA: Diagnosis not present

## 2018-02-07 DIAGNOSIS — M4604 Spinal enthesopathy, thoracic region: Secondary | ICD-10-CM | POA: Diagnosis not present

## 2018-02-07 DIAGNOSIS — M9905 Segmental and somatic dysfunction of pelvic region: Secondary | ICD-10-CM | POA: Diagnosis not present

## 2018-02-07 DIAGNOSIS — M9902 Segmental and somatic dysfunction of thoracic region: Secondary | ICD-10-CM | POA: Diagnosis not present

## 2018-02-14 DIAGNOSIS — M9902 Segmental and somatic dysfunction of thoracic region: Secondary | ICD-10-CM | POA: Diagnosis not present

## 2018-02-14 DIAGNOSIS — M4604 Spinal enthesopathy, thoracic region: Secondary | ICD-10-CM | POA: Diagnosis not present

## 2018-02-14 DIAGNOSIS — M9905 Segmental and somatic dysfunction of pelvic region: Secondary | ICD-10-CM | POA: Diagnosis not present

## 2018-02-14 DIAGNOSIS — M9903 Segmental and somatic dysfunction of lumbar region: Secondary | ICD-10-CM | POA: Diagnosis not present

## 2018-02-28 DIAGNOSIS — M9905 Segmental and somatic dysfunction of pelvic region: Secondary | ICD-10-CM | POA: Diagnosis not present

## 2018-02-28 DIAGNOSIS — M9902 Segmental and somatic dysfunction of thoracic region: Secondary | ICD-10-CM | POA: Diagnosis not present

## 2018-02-28 DIAGNOSIS — M9903 Segmental and somatic dysfunction of lumbar region: Secondary | ICD-10-CM | POA: Diagnosis not present

## 2018-02-28 DIAGNOSIS — M4604 Spinal enthesopathy, thoracic region: Secondary | ICD-10-CM | POA: Diagnosis not present

## 2018-03-02 DIAGNOSIS — D1801 Hemangioma of skin and subcutaneous tissue: Secondary | ICD-10-CM | POA: Diagnosis not present

## 2018-03-02 DIAGNOSIS — Z85828 Personal history of other malignant neoplasm of skin: Secondary | ICD-10-CM | POA: Diagnosis not present

## 2018-03-02 DIAGNOSIS — L814 Other melanin hyperpigmentation: Secondary | ICD-10-CM | POA: Diagnosis not present

## 2018-03-02 DIAGNOSIS — D225 Melanocytic nevi of trunk: Secondary | ICD-10-CM | POA: Diagnosis not present

## 2018-03-02 DIAGNOSIS — L821 Other seborrheic keratosis: Secondary | ICD-10-CM | POA: Diagnosis not present

## 2018-03-02 DIAGNOSIS — D234 Other benign neoplasm of skin of scalp and neck: Secondary | ICD-10-CM | POA: Diagnosis not present

## 2018-03-02 DIAGNOSIS — L570 Actinic keratosis: Secondary | ICD-10-CM | POA: Diagnosis not present

## 2018-03-02 DIAGNOSIS — L578 Other skin changes due to chronic exposure to nonionizing radiation: Secondary | ICD-10-CM | POA: Diagnosis not present

## 2018-03-13 DIAGNOSIS — M4604 Spinal enthesopathy, thoracic region: Secondary | ICD-10-CM | POA: Diagnosis not present

## 2018-03-13 DIAGNOSIS — M9902 Segmental and somatic dysfunction of thoracic region: Secondary | ICD-10-CM | POA: Diagnosis not present

## 2018-03-13 DIAGNOSIS — M9905 Segmental and somatic dysfunction of pelvic region: Secondary | ICD-10-CM | POA: Diagnosis not present

## 2018-03-13 DIAGNOSIS — M9903 Segmental and somatic dysfunction of lumbar region: Secondary | ICD-10-CM | POA: Diagnosis not present

## 2018-03-28 DIAGNOSIS — M9903 Segmental and somatic dysfunction of lumbar region: Secondary | ICD-10-CM | POA: Diagnosis not present

## 2018-03-28 DIAGNOSIS — M9902 Segmental and somatic dysfunction of thoracic region: Secondary | ICD-10-CM | POA: Diagnosis not present

## 2018-03-28 DIAGNOSIS — M9905 Segmental and somatic dysfunction of pelvic region: Secondary | ICD-10-CM | POA: Diagnosis not present

## 2018-03-28 DIAGNOSIS — M4604 Spinal enthesopathy, thoracic region: Secondary | ICD-10-CM | POA: Diagnosis not present

## 2018-04-06 DIAGNOSIS — M1712 Unilateral primary osteoarthritis, left knee: Secondary | ICD-10-CM | POA: Diagnosis not present

## 2018-05-02 DIAGNOSIS — M9902 Segmental and somatic dysfunction of thoracic region: Secondary | ICD-10-CM | POA: Diagnosis not present

## 2018-05-02 DIAGNOSIS — M9905 Segmental and somatic dysfunction of pelvic region: Secondary | ICD-10-CM | POA: Diagnosis not present

## 2018-05-02 DIAGNOSIS — M4604 Spinal enthesopathy, thoracic region: Secondary | ICD-10-CM | POA: Diagnosis not present

## 2018-05-02 DIAGNOSIS — M9903 Segmental and somatic dysfunction of lumbar region: Secondary | ICD-10-CM | POA: Diagnosis not present

## 2018-06-06 DIAGNOSIS — M9903 Segmental and somatic dysfunction of lumbar region: Secondary | ICD-10-CM | POA: Diagnosis not present

## 2018-06-06 DIAGNOSIS — M9905 Segmental and somatic dysfunction of pelvic region: Secondary | ICD-10-CM | POA: Diagnosis not present

## 2018-06-06 DIAGNOSIS — M4604 Spinal enthesopathy, thoracic region: Secondary | ICD-10-CM | POA: Diagnosis not present

## 2018-06-06 DIAGNOSIS — M9902 Segmental and somatic dysfunction of thoracic region: Secondary | ICD-10-CM | POA: Diagnosis not present

## 2018-06-25 DIAGNOSIS — L578 Other skin changes due to chronic exposure to nonionizing radiation: Secondary | ICD-10-CM | POA: Diagnosis not present

## 2018-06-25 DIAGNOSIS — L570 Actinic keratosis: Secondary | ICD-10-CM | POA: Diagnosis not present

## 2018-06-25 DIAGNOSIS — B079 Viral wart, unspecified: Secondary | ICD-10-CM | POA: Diagnosis not present

## 2018-08-08 DIAGNOSIS — M9902 Segmental and somatic dysfunction of thoracic region: Secondary | ICD-10-CM | POA: Diagnosis not present

## 2018-08-08 DIAGNOSIS — M4604 Spinal enthesopathy, thoracic region: Secondary | ICD-10-CM | POA: Diagnosis not present

## 2018-08-08 DIAGNOSIS — M9905 Segmental and somatic dysfunction of pelvic region: Secondary | ICD-10-CM | POA: Diagnosis not present

## 2018-08-08 DIAGNOSIS — M9903 Segmental and somatic dysfunction of lumbar region: Secondary | ICD-10-CM | POA: Diagnosis not present

## 2018-08-13 DIAGNOSIS — R361 Hematospermia: Secondary | ICD-10-CM | POA: Diagnosis not present

## 2018-08-13 DIAGNOSIS — Z125 Encounter for screening for malignant neoplasm of prostate: Secondary | ICD-10-CM | POA: Diagnosis not present

## 2018-09-12 DIAGNOSIS — N35011 Post-traumatic bulbous urethral stricture: Secondary | ICD-10-CM | POA: Diagnosis not present

## 2018-09-12 DIAGNOSIS — R361 Hematospermia: Secondary | ICD-10-CM | POA: Diagnosis not present

## 2018-09-17 DIAGNOSIS — R42 Dizziness and giddiness: Secondary | ICD-10-CM | POA: Diagnosis not present

## 2018-09-17 DIAGNOSIS — E039 Hypothyroidism, unspecified: Secondary | ICD-10-CM | POA: Diagnosis not present

## 2018-09-17 DIAGNOSIS — M5137 Other intervertebral disc degeneration, lumbosacral region: Secondary | ICD-10-CM | POA: Diagnosis not present

## 2018-09-17 DIAGNOSIS — Z1389 Encounter for screening for other disorder: Secondary | ICD-10-CM | POA: Diagnosis not present

## 2018-09-17 DIAGNOSIS — Z0000 Encounter for general adult medical examination without abnormal findings: Secondary | ICD-10-CM | POA: Diagnosis not present

## 2018-09-26 DIAGNOSIS — H838X3 Other specified diseases of inner ear, bilateral: Secondary | ICD-10-CM | POA: Diagnosis not present

## 2018-09-26 DIAGNOSIS — R42 Dizziness and giddiness: Secondary | ICD-10-CM | POA: Diagnosis not present

## 2018-09-26 DIAGNOSIS — H903 Sensorineural hearing loss, bilateral: Secondary | ICD-10-CM | POA: Diagnosis not present

## 2018-09-26 DIAGNOSIS — R070 Pain in throat: Secondary | ICD-10-CM | POA: Diagnosis not present

## 2018-10-03 DIAGNOSIS — H903 Sensorineural hearing loss, bilateral: Secondary | ICD-10-CM | POA: Diagnosis not present

## 2018-10-03 DIAGNOSIS — R42 Dizziness and giddiness: Secondary | ICD-10-CM | POA: Diagnosis not present

## 2018-10-12 DIAGNOSIS — H832X2 Labyrinthine dysfunction, left ear: Secondary | ICD-10-CM | POA: Diagnosis not present

## 2018-10-24 DIAGNOSIS — H832X2 Labyrinthine dysfunction, left ear: Secondary | ICD-10-CM | POA: Diagnosis not present

## 2018-10-30 DIAGNOSIS — M9902 Segmental and somatic dysfunction of thoracic region: Secondary | ICD-10-CM | POA: Diagnosis not present

## 2018-10-30 DIAGNOSIS — M9905 Segmental and somatic dysfunction of pelvic region: Secondary | ICD-10-CM | POA: Diagnosis not present

## 2018-10-30 DIAGNOSIS — M4604 Spinal enthesopathy, thoracic region: Secondary | ICD-10-CM | POA: Diagnosis not present

## 2018-10-30 DIAGNOSIS — M9903 Segmental and somatic dysfunction of lumbar region: Secondary | ICD-10-CM | POA: Diagnosis not present

## 2018-11-07 DIAGNOSIS — H832X2 Labyrinthine dysfunction, left ear: Secondary | ICD-10-CM | POA: Diagnosis not present

## 2018-11-07 DIAGNOSIS — M9902 Segmental and somatic dysfunction of thoracic region: Secondary | ICD-10-CM | POA: Diagnosis not present

## 2018-11-07 DIAGNOSIS — M4604 Spinal enthesopathy, thoracic region: Secondary | ICD-10-CM | POA: Diagnosis not present

## 2018-11-07 DIAGNOSIS — M9905 Segmental and somatic dysfunction of pelvic region: Secondary | ICD-10-CM | POA: Diagnosis not present

## 2018-11-07 DIAGNOSIS — M9903 Segmental and somatic dysfunction of lumbar region: Secondary | ICD-10-CM | POA: Diagnosis not present

## 2018-11-14 DIAGNOSIS — H832X2 Labyrinthine dysfunction, left ear: Secondary | ICD-10-CM | POA: Diagnosis not present

## 2018-12-14 DIAGNOSIS — H04123 Dry eye syndrome of bilateral lacrimal glands: Secondary | ICD-10-CM | POA: Diagnosis not present

## 2018-12-14 DIAGNOSIS — H00012 Hordeolum externum right lower eyelid: Secondary | ICD-10-CM | POA: Diagnosis not present

## 2018-12-28 DIAGNOSIS — M9905 Segmental and somatic dysfunction of pelvic region: Secondary | ICD-10-CM | POA: Diagnosis not present

## 2018-12-28 DIAGNOSIS — M4604 Spinal enthesopathy, thoracic region: Secondary | ICD-10-CM | POA: Diagnosis not present

## 2018-12-28 DIAGNOSIS — M9903 Segmental and somatic dysfunction of lumbar region: Secondary | ICD-10-CM | POA: Diagnosis not present

## 2018-12-28 DIAGNOSIS — M9902 Segmental and somatic dysfunction of thoracic region: Secondary | ICD-10-CM | POA: Diagnosis not present

## 2019-01-02 DIAGNOSIS — M9905 Segmental and somatic dysfunction of pelvic region: Secondary | ICD-10-CM | POA: Diagnosis not present

## 2019-01-02 DIAGNOSIS — M9903 Segmental and somatic dysfunction of lumbar region: Secondary | ICD-10-CM | POA: Diagnosis not present

## 2019-01-02 DIAGNOSIS — M9902 Segmental and somatic dysfunction of thoracic region: Secondary | ICD-10-CM | POA: Diagnosis not present

## 2019-01-02 DIAGNOSIS — H00012 Hordeolum externum right lower eyelid: Secondary | ICD-10-CM | POA: Diagnosis not present

## 2019-01-02 DIAGNOSIS — M4604 Spinal enthesopathy, thoracic region: Secondary | ICD-10-CM | POA: Diagnosis not present

## 2019-01-09 DIAGNOSIS — L570 Actinic keratosis: Secondary | ICD-10-CM | POA: Diagnosis not present

## 2019-01-09 DIAGNOSIS — C44622 Squamous cell carcinoma of skin of right upper limb, including shoulder: Secondary | ICD-10-CM | POA: Diagnosis not present

## 2019-01-15 ENCOUNTER — Telehealth: Payer: Self-pay | Admitting: Internal Medicine

## 2019-01-15 NOTE — Telephone Encounter (Signed)
Copied from [place] [id number]. Topic: General - Inquiry >> Jan 15, 2019  8:15 AM [name] wrote: Reason for CRM: Pt stated that Dr. Paulla Fore helped him get a shoulder brace from [employer]. He would like to know if anyone in the office would have a contact number for them that can be given to him. Please advise. CB#[phone]

## 2019-01-15 NOTE — Telephone Encounter (Signed)
Returned pt's call and LM w/ his wife giving her the [employer] phone number and website info.

## 2019-01-16 DIAGNOSIS — M9905 Segmental and somatic dysfunction of pelvic region: Secondary | ICD-10-CM | POA: Diagnosis not present

## 2019-01-16 DIAGNOSIS — M4604 Spinal enthesopathy, thoracic region: Secondary | ICD-10-CM | POA: Diagnosis not present

## 2019-01-16 DIAGNOSIS — M9902 Segmental and somatic dysfunction of thoracic region: Secondary | ICD-10-CM | POA: Diagnosis not present

## 2019-01-16 DIAGNOSIS — M9903 Segmental and somatic dysfunction of lumbar region: Secondary | ICD-10-CM | POA: Diagnosis not present

## 2019-01-30 DIAGNOSIS — M4604 Spinal enthesopathy, thoracic region: Secondary | ICD-10-CM | POA: Diagnosis not present

## 2019-01-30 DIAGNOSIS — M9903 Segmental and somatic dysfunction of lumbar region: Secondary | ICD-10-CM | POA: Diagnosis not present

## 2019-01-30 DIAGNOSIS — M9905 Segmental and somatic dysfunction of pelvic region: Secondary | ICD-10-CM | POA: Diagnosis not present

## 2019-01-30 DIAGNOSIS — M9902 Segmental and somatic dysfunction of thoracic region: Secondary | ICD-10-CM | POA: Diagnosis not present

## 2019-02-08 DIAGNOSIS — H00012 Hordeolum externum right lower eyelid: Secondary | ICD-10-CM | POA: Diagnosis not present

## 2019-02-25 DIAGNOSIS — L812 Freckles: Secondary | ICD-10-CM | POA: Diagnosis not present

## 2019-02-25 DIAGNOSIS — D1722 Benign lipomatous neoplasm of skin and subcutaneous tissue of left arm: Secondary | ICD-10-CM | POA: Diagnosis not present

## 2019-02-25 DIAGNOSIS — L570 Actinic keratosis: Secondary | ICD-10-CM | POA: Diagnosis not present

## 2019-02-25 DIAGNOSIS — L723 Sebaceous cyst: Secondary | ICD-10-CM | POA: Diagnosis not present

## 2019-02-25 DIAGNOSIS — L821 Other seborrheic keratosis: Secondary | ICD-10-CM | POA: Diagnosis not present

## 2019-02-25 DIAGNOSIS — Z85828 Personal history of other malignant neoplasm of skin: Secondary | ICD-10-CM | POA: Diagnosis not present

## 2019-02-28 DIAGNOSIS — E039 Hypothyroidism, unspecified: Secondary | ICD-10-CM | POA: Insufficient documentation

## 2019-03-01 DIAGNOSIS — H0012 Chalazion right lower eyelid: Secondary | ICD-10-CM | POA: Diagnosis not present

## 2019-03-01 DIAGNOSIS — H57813 Brow ptosis, bilateral: Secondary | ICD-10-CM | POA: Diagnosis not present

## 2019-03-01 DIAGNOSIS — D210 Benign neoplasm of connective and other soft tissue of head, face and neck: Secondary | ICD-10-CM | POA: Diagnosis not present

## 2019-03-20 DIAGNOSIS — M9905 Segmental and somatic dysfunction of pelvic region: Secondary | ICD-10-CM | POA: Diagnosis not present

## 2019-03-20 DIAGNOSIS — M4604 Spinal enthesopathy, thoracic region: Secondary | ICD-10-CM | POA: Diagnosis not present

## 2019-03-20 DIAGNOSIS — M9903 Segmental and somatic dysfunction of lumbar region: Secondary | ICD-10-CM | POA: Diagnosis not present

## 2019-03-20 DIAGNOSIS — M9902 Segmental and somatic dysfunction of thoracic region: Secondary | ICD-10-CM | POA: Diagnosis not present

## 2019-03-22 DIAGNOSIS — K4090 Unilateral inguinal hernia, without obstruction or gangrene, not specified as recurrent: Secondary | ICD-10-CM | POA: Diagnosis not present

## 2019-04-08 DIAGNOSIS — D210 Benign neoplasm of connective and other soft tissue of head, face and neck: Secondary | ICD-10-CM | POA: Diagnosis not present

## 2019-04-08 DIAGNOSIS — H57813 Brow ptosis, bilateral: Secondary | ICD-10-CM | POA: Diagnosis not present

## 2019-04-08 DIAGNOSIS — H0015 Chalazion left lower eyelid: Secondary | ICD-10-CM | POA: Diagnosis not present

## 2019-04-10 DIAGNOSIS — M9905 Segmental and somatic dysfunction of pelvic region: Secondary | ICD-10-CM | POA: Diagnosis not present

## 2019-04-10 DIAGNOSIS — M9903 Segmental and somatic dysfunction of lumbar region: Secondary | ICD-10-CM | POA: Diagnosis not present

## 2019-04-10 DIAGNOSIS — M4604 Spinal enthesopathy, thoracic region: Secondary | ICD-10-CM | POA: Diagnosis not present

## 2019-04-10 DIAGNOSIS — M9902 Segmental and somatic dysfunction of thoracic region: Secondary | ICD-10-CM | POA: Diagnosis not present

## 2019-04-15 DIAGNOSIS — M9902 Segmental and somatic dysfunction of thoracic region: Secondary | ICD-10-CM | POA: Diagnosis not present

## 2019-04-15 DIAGNOSIS — M9905 Segmental and somatic dysfunction of pelvic region: Secondary | ICD-10-CM | POA: Diagnosis not present

## 2019-04-15 DIAGNOSIS — M4604 Spinal enthesopathy, thoracic region: Secondary | ICD-10-CM | POA: Diagnosis not present

## 2019-04-15 DIAGNOSIS — M9903 Segmental and somatic dysfunction of lumbar region: Secondary | ICD-10-CM | POA: Diagnosis not present

## 2019-04-17 DIAGNOSIS — M4604 Spinal enthesopathy, thoracic region: Secondary | ICD-10-CM | POA: Diagnosis not present

## 2019-04-17 DIAGNOSIS — M9903 Segmental and somatic dysfunction of lumbar region: Secondary | ICD-10-CM | POA: Diagnosis not present

## 2019-04-17 DIAGNOSIS — M9902 Segmental and somatic dysfunction of thoracic region: Secondary | ICD-10-CM | POA: Diagnosis not present

## 2019-04-17 DIAGNOSIS — M9905 Segmental and somatic dysfunction of pelvic region: Secondary | ICD-10-CM | POA: Diagnosis not present

## 2019-04-18 DIAGNOSIS — M9905 Segmental and somatic dysfunction of pelvic region: Secondary | ICD-10-CM | POA: Diagnosis not present

## 2019-04-18 DIAGNOSIS — M4604 Spinal enthesopathy, thoracic region: Secondary | ICD-10-CM | POA: Diagnosis not present

## 2019-04-18 DIAGNOSIS — M9902 Segmental and somatic dysfunction of thoracic region: Secondary | ICD-10-CM | POA: Diagnosis not present

## 2019-04-18 DIAGNOSIS — M9903 Segmental and somatic dysfunction of lumbar region: Secondary | ICD-10-CM | POA: Diagnosis not present

## 2019-04-22 DIAGNOSIS — H00025 Hordeolum internum left lower eyelid: Secondary | ICD-10-CM | POA: Diagnosis not present

## 2019-04-22 DIAGNOSIS — H0102A Squamous blepharitis right eye, upper and lower eyelids: Secondary | ICD-10-CM | POA: Diagnosis not present

## 2019-04-22 DIAGNOSIS — H00029 Hordeolum internum unspecified eye, unspecified eyelid: Secondary | ICD-10-CM | POA: Diagnosis not present

## 2019-04-22 DIAGNOSIS — H0102B Squamous blepharitis left eye, upper and lower eyelids: Secondary | ICD-10-CM | POA: Diagnosis not present

## 2019-04-23 DIAGNOSIS — M9902 Segmental and somatic dysfunction of thoracic region: Secondary | ICD-10-CM | POA: Diagnosis not present

## 2019-04-23 DIAGNOSIS — M9903 Segmental and somatic dysfunction of lumbar region: Secondary | ICD-10-CM | POA: Diagnosis not present

## 2019-04-23 DIAGNOSIS — M4604 Spinal enthesopathy, thoracic region: Secondary | ICD-10-CM | POA: Diagnosis not present

## 2019-04-23 DIAGNOSIS — M9905 Segmental and somatic dysfunction of pelvic region: Secondary | ICD-10-CM | POA: Diagnosis not present

## 2019-04-25 DIAGNOSIS — M9905 Segmental and somatic dysfunction of pelvic region: Secondary | ICD-10-CM | POA: Diagnosis not present

## 2019-04-25 DIAGNOSIS — M9902 Segmental and somatic dysfunction of thoracic region: Secondary | ICD-10-CM | POA: Diagnosis not present

## 2019-04-25 DIAGNOSIS — M9903 Segmental and somatic dysfunction of lumbar region: Secondary | ICD-10-CM | POA: Diagnosis not present

## 2019-04-25 DIAGNOSIS — M4604 Spinal enthesopathy, thoracic region: Secondary | ICD-10-CM | POA: Diagnosis not present

## 2019-05-08 DIAGNOSIS — M25511 Pain in right shoulder: Secondary | ICD-10-CM | POA: Diagnosis not present

## 2019-05-27 DIAGNOSIS — D1801 Hemangioma of skin and subcutaneous tissue: Secondary | ICD-10-CM | POA: Diagnosis not present

## 2019-05-27 DIAGNOSIS — D485 Neoplasm of uncertain behavior of skin: Secondary | ICD-10-CM | POA: Diagnosis not present

## 2019-05-27 DIAGNOSIS — L570 Actinic keratosis: Secondary | ICD-10-CM | POA: Diagnosis not present

## 2019-05-27 DIAGNOSIS — L820 Inflamed seborrheic keratosis: Secondary | ICD-10-CM | POA: Diagnosis not present

## 2019-05-27 DIAGNOSIS — Z85828 Personal history of other malignant neoplasm of skin: Secondary | ICD-10-CM | POA: Diagnosis not present

## 2019-06-25 DIAGNOSIS — M9902 Segmental and somatic dysfunction of thoracic region: Secondary | ICD-10-CM | POA: Diagnosis not present

## 2019-06-25 DIAGNOSIS — M4604 Spinal enthesopathy, thoracic region: Secondary | ICD-10-CM | POA: Diagnosis not present

## 2019-06-25 DIAGNOSIS — M9905 Segmental and somatic dysfunction of pelvic region: Secondary | ICD-10-CM | POA: Diagnosis not present

## 2019-06-25 DIAGNOSIS — M9903 Segmental and somatic dysfunction of lumbar region: Secondary | ICD-10-CM | POA: Diagnosis not present

## 2019-06-27 DIAGNOSIS — M4604 Spinal enthesopathy, thoracic region: Secondary | ICD-10-CM | POA: Diagnosis not present

## 2019-06-27 DIAGNOSIS — M9902 Segmental and somatic dysfunction of thoracic region: Secondary | ICD-10-CM | POA: Diagnosis not present

## 2019-06-27 DIAGNOSIS — M9903 Segmental and somatic dysfunction of lumbar region: Secondary | ICD-10-CM | POA: Diagnosis not present

## 2019-06-27 DIAGNOSIS — M9905 Segmental and somatic dysfunction of pelvic region: Secondary | ICD-10-CM | POA: Diagnosis not present

## 2019-07-03 DIAGNOSIS — M9905 Segmental and somatic dysfunction of pelvic region: Secondary | ICD-10-CM | POA: Diagnosis not present

## 2019-07-03 DIAGNOSIS — M9903 Segmental and somatic dysfunction of lumbar region: Secondary | ICD-10-CM | POA: Diagnosis not present

## 2019-07-03 DIAGNOSIS — M4604 Spinal enthesopathy, thoracic region: Secondary | ICD-10-CM | POA: Diagnosis not present

## 2019-07-03 DIAGNOSIS — M9902 Segmental and somatic dysfunction of thoracic region: Secondary | ICD-10-CM | POA: Diagnosis not present

## 2019-07-30 DIAGNOSIS — L821 Other seborrheic keratosis: Secondary | ICD-10-CM | POA: Diagnosis not present

## 2019-07-30 DIAGNOSIS — Z85828 Personal history of other malignant neoplasm of skin: Secondary | ICD-10-CM | POA: Diagnosis not present

## 2019-08-08 ENCOUNTER — Other Ambulatory Visit: Payer: Self-pay | Admitting: Sports Medicine

## 2019-08-12 ENCOUNTER — Ambulatory Visit
Admission: RE | Admit: 2019-08-12 | Discharge: 2019-08-12 | Disposition: A | Discharge status: Home / Self Care | Payer: Medicare Other | Source: Ambulatory Visit | Attending: Sports Medicine | Admitting: Sports Medicine

## 2019-08-12 ENCOUNTER — Other Ambulatory Visit: Payer: Self-pay | Admitting: Sports Medicine

## 2019-08-12 ENCOUNTER — Other Ambulatory Visit: Payer: Self-pay

## 2019-08-12 DIAGNOSIS — K4090 Unilateral inguinal hernia, without obstruction or gangrene, not specified as recurrent: Secondary | ICD-10-CM

## 2019-09-04 DIAGNOSIS — Z13820 Encounter for screening for osteoporosis: Secondary | ICD-10-CM | POA: Diagnosis not present

## 2019-09-04 DIAGNOSIS — Z7952 Long term (current) use of systemic steroids: Secondary | ICD-10-CM | POA: Diagnosis not present

## 2019-09-04 DIAGNOSIS — M25559 Pain in unspecified hip: Secondary | ICD-10-CM | POA: Diagnosis not present

## 2019-09-04 DIAGNOSIS — M1990 Unspecified osteoarthritis, unspecified site: Secondary | ICD-10-CM | POA: Diagnosis not present

## 2019-09-04 DIAGNOSIS — M064 Inflammatory polyarthropathy: Secondary | ICD-10-CM | POA: Diagnosis not present

## 2019-09-04 DIAGNOSIS — M25519 Pain in unspecified shoulder: Secondary | ICD-10-CM | POA: Diagnosis not present

## 2019-09-04 DIAGNOSIS — M353 Polymyalgia rheumatica: Secondary | ICD-10-CM | POA: Diagnosis not present

## 2019-09-04 DIAGNOSIS — M810 Age-related osteoporosis without current pathological fracture: Secondary | ICD-10-CM | POA: Diagnosis not present

## 2019-09-23 DIAGNOSIS — M810 Age-related osteoporosis without current pathological fracture: Secondary | ICD-10-CM | POA: Diagnosis not present

## 2019-09-23 DIAGNOSIS — Z0000 Encounter for general adult medical examination without abnormal findings: Secondary | ICD-10-CM | POA: Diagnosis not present

## 2019-09-23 DIAGNOSIS — Z1389 Encounter for screening for other disorder: Secondary | ICD-10-CM | POA: Diagnosis not present

## 2019-09-23 DIAGNOSIS — Z13220 Encounter for screening for lipoid disorders: Secondary | ICD-10-CM | POA: Diagnosis not present

## 2019-09-23 DIAGNOSIS — E039 Hypothyroidism, unspecified: Secondary | ICD-10-CM | POA: Diagnosis not present

## 2019-09-23 DIAGNOSIS — Z125 Encounter for screening for malignant neoplasm of prostate: Secondary | ICD-10-CM | POA: Diagnosis not present

## 2019-09-23 DIAGNOSIS — M19012 Primary osteoarthritis, left shoulder: Secondary | ICD-10-CM | POA: Diagnosis not present

## 2019-09-23 DIAGNOSIS — M353 Polymyalgia rheumatica: Secondary | ICD-10-CM | POA: Diagnosis not present

## 2019-09-23 DIAGNOSIS — Z136 Encounter for screening for cardiovascular disorders: Secondary | ICD-10-CM | POA: Diagnosis not present

## 2019-10-16 DIAGNOSIS — K219 Gastro-esophageal reflux disease without esophagitis: Secondary | ICD-10-CM | POA: Diagnosis not present

## 2019-10-16 DIAGNOSIS — Z7952 Long term (current) use of systemic steroids: Secondary | ICD-10-CM | POA: Diagnosis not present

## 2019-10-16 DIAGNOSIS — M810 Age-related osteoporosis without current pathological fracture: Secondary | ICD-10-CM | POA: Diagnosis not present

## 2019-10-16 DIAGNOSIS — M25559 Pain in unspecified hip: Secondary | ICD-10-CM | POA: Diagnosis not present

## 2019-10-16 DIAGNOSIS — M353 Polymyalgia rheumatica: Secondary | ICD-10-CM | POA: Diagnosis not present

## 2019-10-16 DIAGNOSIS — M1990 Unspecified osteoarthritis, unspecified site: Secondary | ICD-10-CM | POA: Diagnosis not present

## 2019-10-16 DIAGNOSIS — M25519 Pain in unspecified shoulder: Secondary | ICD-10-CM | POA: Diagnosis not present

## 2019-11-11 DIAGNOSIS — Z7952 Long term (current) use of systemic steroids: Secondary | ICD-10-CM | POA: Diagnosis not present

## 2019-11-11 DIAGNOSIS — R739 Hyperglycemia, unspecified: Secondary | ICD-10-CM | POA: Diagnosis not present

## 2019-11-11 DIAGNOSIS — E559 Vitamin D deficiency, unspecified: Secondary | ICD-10-CM | POA: Diagnosis not present

## 2019-11-11 DIAGNOSIS — M810 Age-related osteoporosis without current pathological fracture: Secondary | ICD-10-CM | POA: Diagnosis not present

## 2019-11-11 DIAGNOSIS — E890 Postprocedural hypothyroidism: Secondary | ICD-10-CM | POA: Diagnosis not present

## 2019-11-11 DIAGNOSIS — E039 Hypothyroidism, unspecified: Secondary | ICD-10-CM | POA: Diagnosis not present

## 2019-11-13 DIAGNOSIS — M810 Age-related osteoporosis without current pathological fracture: Secondary | ICD-10-CM | POA: Diagnosis not present

## 2019-11-18 DIAGNOSIS — E890 Postprocedural hypothyroidism: Secondary | ICD-10-CM | POA: Diagnosis not present

## 2019-11-18 DIAGNOSIS — R739 Hyperglycemia, unspecified: Secondary | ICD-10-CM | POA: Diagnosis not present

## 2019-11-18 DIAGNOSIS — E039 Hypothyroidism, unspecified: Secondary | ICD-10-CM | POA: Diagnosis not present

## 2019-11-18 DIAGNOSIS — Z7952 Long term (current) use of systemic steroids: Secondary | ICD-10-CM | POA: Diagnosis not present

## 2019-11-18 DIAGNOSIS — E559 Vitamin D deficiency, unspecified: Secondary | ICD-10-CM | POA: Diagnosis not present

## 2019-11-18 DIAGNOSIS — E785 Hyperlipidemia, unspecified: Secondary | ICD-10-CM | POA: Diagnosis not present

## 2019-11-18 DIAGNOSIS — M810 Age-related osteoporosis without current pathological fracture: Secondary | ICD-10-CM | POA: Diagnosis not present

## 2019-12-11 DIAGNOSIS — K219 Gastro-esophageal reflux disease without esophagitis: Secondary | ICD-10-CM | POA: Diagnosis not present

## 2019-12-11 DIAGNOSIS — M25519 Pain in unspecified shoulder: Secondary | ICD-10-CM | POA: Diagnosis not present

## 2019-12-11 DIAGNOSIS — M810 Age-related osteoporosis without current pathological fracture: Secondary | ICD-10-CM | POA: Diagnosis not present

## 2019-12-11 DIAGNOSIS — Z7952 Long term (current) use of systemic steroids: Secondary | ICD-10-CM | POA: Diagnosis not present

## 2019-12-11 DIAGNOSIS — M1990 Unspecified osteoarthritis, unspecified site: Secondary | ICD-10-CM | POA: Diagnosis not present

## 2019-12-11 DIAGNOSIS — E559 Vitamin D deficiency, unspecified: Secondary | ICD-10-CM | POA: Diagnosis not present

## 2019-12-11 DIAGNOSIS — M353 Polymyalgia rheumatica: Secondary | ICD-10-CM | POA: Diagnosis not present

## 2019-12-11 DIAGNOSIS — M25559 Pain in unspecified hip: Secondary | ICD-10-CM | POA: Diagnosis not present

## 2019-12-20 DIAGNOSIS — K4090 Unilateral inguinal hernia, without obstruction or gangrene, not specified as recurrent: Secondary | ICD-10-CM | POA: Diagnosis not present

## 2019-12-24 ENCOUNTER — Other Ambulatory Visit: Payer: Self-pay

## 2019-12-24 ENCOUNTER — Ambulatory Visit (INDEPENDENT_AMBULATORY_CARE_PROVIDER_SITE_OTHER): Payer: Medicare Other | Admitting: Podiatry

## 2019-12-24 ENCOUNTER — Encounter: Payer: Self-pay | Admitting: Podiatry

## 2019-12-24 DIAGNOSIS — Q828 Other specified congenital malformations of skin: Secondary | ICD-10-CM | POA: Diagnosis not present

## 2019-12-24 DIAGNOSIS — L603 Nail dystrophy: Secondary | ICD-10-CM | POA: Diagnosis not present

## 2019-12-25 NOTE — Progress Notes (Signed)
He presents today concerned about calluses on the toe he has 1 on the medial aspect tibial nail border fourth toe left foot.  He also has a pinch callus on the plantar aspect fourth toe right foot.  He is also wants to know the results of his pathology which he never returned for 3 years ago.  Objective: Vital signs are stable he is alert and oriented x3 pulses remain strong and palpable.  No open lesions or wounds his toenails are still thick and clinically mycotic his pathology report did demonstrate separate fights as well as dermatophytes.  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  I discussed briefly today with him surgical correction of those toes should they bother him enough otherwise paddings can be his only other option.  Assessment: Onychomycosis digital deformity resulting in reactive hyperkeratosis.  Plan: I debrided all reactive hyperkeratotic tissue we discussed the pros and cons of oral therapy versus laser therapy.  At this point he would like to try laser therapy and he will follow up with Caryl Pina for that.

## 2019-12-27 DIAGNOSIS — H16223 Keratoconjunctivitis sicca, not specified as Sjogren's, bilateral: Secondary | ICD-10-CM | POA: Diagnosis not present

## 2020-02-03 ENCOUNTER — Other Ambulatory Visit: Payer: Medicare Other

## 2020-02-14 ENCOUNTER — Other Ambulatory Visit: Payer: Medicare Other

## 2020-03-13 ENCOUNTER — Other Ambulatory Visit: Payer: Medicare Other

## 2020-03-18 DIAGNOSIS — M1990 Unspecified osteoarthritis, unspecified site: Secondary | ICD-10-CM | POA: Diagnosis not present

## 2020-03-18 DIAGNOSIS — Z7952 Long term (current) use of systemic steroids: Secondary | ICD-10-CM | POA: Diagnosis not present

## 2020-03-18 DIAGNOSIS — M353 Polymyalgia rheumatica: Secondary | ICD-10-CM | POA: Diagnosis not present

## 2020-03-18 DIAGNOSIS — K219 Gastro-esophageal reflux disease without esophagitis: Secondary | ICD-10-CM | POA: Diagnosis not present

## 2020-03-18 DIAGNOSIS — M810 Age-related osteoporosis without current pathological fracture: Secondary | ICD-10-CM | POA: Diagnosis not present

## 2020-03-18 DIAGNOSIS — M25519 Pain in unspecified shoulder: Secondary | ICD-10-CM | POA: Diagnosis not present

## 2020-03-18 DIAGNOSIS — M25559 Pain in unspecified hip: Secondary | ICD-10-CM | POA: Diagnosis not present

## 2020-04-20 DIAGNOSIS — E039 Hypothyroidism, unspecified: Secondary | ICD-10-CM | POA: Diagnosis not present

## 2020-04-20 DIAGNOSIS — E559 Vitamin D deficiency, unspecified: Secondary | ICD-10-CM | POA: Diagnosis not present

## 2020-04-20 DIAGNOSIS — E785 Hyperlipidemia, unspecified: Secondary | ICD-10-CM | POA: Diagnosis not present

## 2020-04-20 DIAGNOSIS — M810 Age-related osteoporosis without current pathological fracture: Secondary | ICD-10-CM | POA: Diagnosis not present

## 2020-04-20 DIAGNOSIS — R739 Hyperglycemia, unspecified: Secondary | ICD-10-CM | POA: Diagnosis not present

## 2020-05-06 DIAGNOSIS — Z7952 Long term (current) use of systemic steroids: Secondary | ICD-10-CM | POA: Diagnosis not present

## 2020-05-06 DIAGNOSIS — E785 Hyperlipidemia, unspecified: Secondary | ICD-10-CM | POA: Diagnosis not present

## 2020-05-06 DIAGNOSIS — E039 Hypothyroidism, unspecified: Secondary | ICD-10-CM | POA: Diagnosis not present

## 2020-05-06 DIAGNOSIS — E890 Postprocedural hypothyroidism: Secondary | ICD-10-CM | POA: Diagnosis not present

## 2020-05-06 DIAGNOSIS — R739 Hyperglycemia, unspecified: Secondary | ICD-10-CM | POA: Diagnosis not present

## 2020-05-06 DIAGNOSIS — E559 Vitamin D deficiency, unspecified: Secondary | ICD-10-CM | POA: Diagnosis not present

## 2020-05-06 DIAGNOSIS — M810 Age-related osteoporosis without current pathological fracture: Secondary | ICD-10-CM | POA: Diagnosis not present

## 2020-05-18 DIAGNOSIS — K4090 Unilateral inguinal hernia, without obstruction or gangrene, not specified as recurrent: Secondary | ICD-10-CM | POA: Diagnosis not present

## 2020-05-19 DIAGNOSIS — M9903 Segmental and somatic dysfunction of lumbar region: Secondary | ICD-10-CM | POA: Diagnosis not present

## 2020-05-19 DIAGNOSIS — M25551 Pain in right hip: Secondary | ICD-10-CM | POA: Diagnosis not present

## 2020-05-19 DIAGNOSIS — M353 Polymyalgia rheumatica: Secondary | ICD-10-CM | POA: Diagnosis not present

## 2020-05-19 DIAGNOSIS — M9904 Segmental and somatic dysfunction of sacral region: Secondary | ICD-10-CM | POA: Diagnosis not present

## 2020-05-19 DIAGNOSIS — M9906 Segmental and somatic dysfunction of lower extremity: Secondary | ICD-10-CM | POA: Diagnosis not present

## 2020-05-19 DIAGNOSIS — M6281 Muscle weakness (generalized): Secondary | ICD-10-CM | POA: Diagnosis not present

## 2020-05-27 DIAGNOSIS — M810 Age-related osteoporosis without current pathological fracture: Secondary | ICD-10-CM | POA: Diagnosis not present

## 2020-06-03 DIAGNOSIS — R531 Weakness: Secondary | ICD-10-CM | POA: Diagnosis not present

## 2020-06-03 DIAGNOSIS — M353 Polymyalgia rheumatica: Secondary | ICD-10-CM | POA: Diagnosis not present

## 2020-06-03 DIAGNOSIS — M9903 Segmental and somatic dysfunction of lumbar region: Secondary | ICD-10-CM | POA: Diagnosis not present

## 2020-06-03 DIAGNOSIS — M9904 Segmental and somatic dysfunction of sacral region: Secondary | ICD-10-CM | POA: Diagnosis not present

## 2020-06-03 DIAGNOSIS — M25551 Pain in right hip: Secondary | ICD-10-CM | POA: Diagnosis not present

## 2020-06-03 DIAGNOSIS — M6281 Muscle weakness (generalized): Secondary | ICD-10-CM | POA: Diagnosis not present

## 2020-06-03 DIAGNOSIS — M9906 Segmental and somatic dysfunction of lower extremity: Secondary | ICD-10-CM | POA: Diagnosis not present

## 2020-06-03 DIAGNOSIS — M810 Age-related osteoporosis without current pathological fracture: Secondary | ICD-10-CM | POA: Diagnosis not present

## 2020-06-10 DIAGNOSIS — M353 Polymyalgia rheumatica: Secondary | ICD-10-CM | POA: Diagnosis not present

## 2020-06-10 DIAGNOSIS — R2242 Localized swelling, mass and lump, left lower limb: Secondary | ICD-10-CM | POA: Diagnosis not present

## 2020-06-10 DIAGNOSIS — M79662 Pain in left lower leg: Secondary | ICD-10-CM | POA: Diagnosis not present

## 2020-06-16 ENCOUNTER — Other Ambulatory Visit: Payer: Self-pay | Admitting: Sports Medicine

## 2020-06-16 ENCOUNTER — Ambulatory Visit
Admission: RE | Admit: 2020-06-16 | Discharge: 2020-06-16 | Disposition: A | Discharge status: Home / Self Care | Payer: Medicare Other | Source: Ambulatory Visit | Attending: Sports Medicine | Admitting: Sports Medicine

## 2020-06-16 DIAGNOSIS — M353 Polymyalgia rheumatica: Secondary | ICD-10-CM | POA: Diagnosis not present

## 2020-06-16 DIAGNOSIS — R2689 Other abnormalities of gait and mobility: Secondary | ICD-10-CM | POA: Diagnosis not present

## 2020-06-16 DIAGNOSIS — M6281 Muscle weakness (generalized): Secondary | ICD-10-CM | POA: Diagnosis not present

## 2020-06-16 DIAGNOSIS — M79662 Pain in left lower leg: Secondary | ICD-10-CM | POA: Diagnosis not present

## 2020-06-16 DIAGNOSIS — R531 Weakness: Secondary | ICD-10-CM | POA: Diagnosis not present

## 2020-06-16 DIAGNOSIS — R2242 Localized swelling, mass and lump, left lower limb: Secondary | ICD-10-CM

## 2020-06-16 DIAGNOSIS — M2022 Hallux rigidus, left foot: Secondary | ICD-10-CM | POA: Diagnosis not present

## 2020-06-16 DIAGNOSIS — M7989 Other specified soft tissue disorders: Secondary | ICD-10-CM | POA: Diagnosis not present

## 2020-06-16 DIAGNOSIS — M1712 Unilateral primary osteoarthritis, left knee: Secondary | ICD-10-CM | POA: Diagnosis not present

## 2020-06-17 DIAGNOSIS — M25519 Pain in unspecified shoulder: Secondary | ICD-10-CM | POA: Diagnosis not present

## 2020-06-17 DIAGNOSIS — M353 Polymyalgia rheumatica: Secondary | ICD-10-CM | POA: Diagnosis not present

## 2020-06-17 DIAGNOSIS — M1990 Unspecified osteoarthritis, unspecified site: Secondary | ICD-10-CM | POA: Diagnosis not present

## 2020-06-17 DIAGNOSIS — Z7952 Long term (current) use of systemic steroids: Secondary | ICD-10-CM | POA: Diagnosis not present

## 2020-06-17 DIAGNOSIS — M810 Age-related osteoporosis without current pathological fracture: Secondary | ICD-10-CM | POA: Diagnosis not present

## 2020-06-17 DIAGNOSIS — K219 Gastro-esophageal reflux disease without esophagitis: Secondary | ICD-10-CM | POA: Diagnosis not present

## 2020-06-24 DIAGNOSIS — M810 Age-related osteoporosis without current pathological fracture: Secondary | ICD-10-CM | POA: Diagnosis not present

## 2020-06-24 DIAGNOSIS — M6281 Muscle weakness (generalized): Secondary | ICD-10-CM | POA: Diagnosis not present

## 2020-06-24 DIAGNOSIS — M9906 Segmental and somatic dysfunction of lower extremity: Secondary | ICD-10-CM | POA: Diagnosis not present

## 2020-06-24 DIAGNOSIS — M9904 Segmental and somatic dysfunction of sacral region: Secondary | ICD-10-CM | POA: Diagnosis not present

## 2020-06-24 DIAGNOSIS — M353 Polymyalgia rheumatica: Secondary | ICD-10-CM | POA: Diagnosis not present

## 2020-06-24 DIAGNOSIS — M25551 Pain in right hip: Secondary | ICD-10-CM | POA: Diagnosis not present

## 2020-06-24 DIAGNOSIS — R531 Weakness: Secondary | ICD-10-CM | POA: Diagnosis not present

## 2020-06-24 DIAGNOSIS — M9905 Segmental and somatic dysfunction of pelvic region: Secondary | ICD-10-CM | POA: Diagnosis not present

## 2020-06-24 DIAGNOSIS — M9903 Segmental and somatic dysfunction of lumbar region: Secondary | ICD-10-CM | POA: Diagnosis not present

## 2020-07-02 DIAGNOSIS — R031 Nonspecific low blood-pressure reading: Secondary | ICD-10-CM | POA: Diagnosis not present

## 2020-07-02 DIAGNOSIS — R1012 Left upper quadrant pain: Secondary | ICD-10-CM | POA: Diagnosis not present

## 2020-07-02 DIAGNOSIS — R1011 Right upper quadrant pain: Secondary | ICD-10-CM | POA: Diagnosis not present

## 2020-07-13 DIAGNOSIS — M353 Polymyalgia rheumatica: Secondary | ICD-10-CM | POA: Diagnosis not present

## 2020-07-13 DIAGNOSIS — M9905 Segmental and somatic dysfunction of pelvic region: Secondary | ICD-10-CM | POA: Diagnosis not present

## 2020-07-13 DIAGNOSIS — M6281 Muscle weakness (generalized): Secondary | ICD-10-CM | POA: Diagnosis not present

## 2020-07-13 DIAGNOSIS — M9903 Segmental and somatic dysfunction of lumbar region: Secondary | ICD-10-CM | POA: Diagnosis not present

## 2020-07-13 DIAGNOSIS — M9904 Segmental and somatic dysfunction of sacral region: Secondary | ICD-10-CM | POA: Diagnosis not present

## 2020-07-13 DIAGNOSIS — M9906 Segmental and somatic dysfunction of lower extremity: Secondary | ICD-10-CM | POA: Diagnosis not present

## 2020-07-13 DIAGNOSIS — M810 Age-related osteoporosis without current pathological fracture: Secondary | ICD-10-CM | POA: Diagnosis not present

## 2020-07-13 DIAGNOSIS — M25551 Pain in right hip: Secondary | ICD-10-CM | POA: Diagnosis not present

## 2020-08-20 NOTE — Progress Notes (Signed)
[id number] [place], [place] - [address] [place] [address] Phone: [phone] Fax: [phone]      Your procedure is scheduled on 08/27/2020.  Report to [facility] Main Entrance "A" at 5:30 A.M., and check in at the Admitting office.  Call this number if you have problems the morning of surgery:  [phone]  Call [phone] if you have any questions prior to your surgery date Monday-Friday 8am-4pm    Remember:  Do not eat or drink after midnight the night before your surgery  You may drink clear liquids until 04:30 the morning of your surgery.   Clear liquids allowed are: Water, Non-Citrus Juices (without pulp), Carbonated Beverages, Clear Tea, Black Coffee Only, and Gatorade    Take these medicines the morning of surgery with A SIP OF WATER    predniSONE (DELTASONE)   SYNTHROID  As of today, STOP taking any Aspirin (unless otherwise instructed by your surgeon) Aleve, Naproxen, Ibuprofen, Motrin, Advil, Goody's, BC's, all herbal medications, fish oil, and all vitamins.                      Do not wear jewelry, make up, or nail polish            Do not wear lotions, powders, perfumes/colognes, or deodorant.            Do not shave 48 hours prior to surgery.  Men may shave face and neck.            Do not bring valuables to the hospital.            [facility] is not responsible for any belongings or valuables.  Do NOT Smoke (Tobacco/Vaping) or drink Alcohol 24 hours prior to your procedure If you use a CPAP at night, you may bring all equipment for your overnight stay.   Contacts, glasses, dentures or bridgework may not be worn into surgery.      For patients admitted to the hospital, discharge time will be determined by your treatment team.   Patients discharged the day of surgery will not be allowed to drive home, and someone needs to stay with them for 24 hours.    Special instructions:   [place]- Preparing For  Surgery  Before surgery, you can play an important role. Because skin is not sterile, your skin needs to be as free of germs as possible. You can reduce the number of germs on your skin by washing with CHG (chlorahexidine gluconate) Soap before surgery.  CHG is an antiseptic cleaner which kills germs and bonds with the skin to continue killing germs even after washing.    Oral Hygiene is also important to reduce your risk of infection.  Remember - BRUSH YOUR TEETH THE MORNING OF SURGERY WITH YOUR REGULAR TOOTHPASTE  Please do not use if you have an allergy to CHG or antibacterial soaps. If your skin becomes reddened/irritated stop using the CHG.  Do not shave (including legs and underarms) for at least 48 hours prior to first CHG shower. It is OK to shave your face.  Please follow these instructions carefully.   1. Shower the NIGHT BEFORE SURGERY and the MORNING OF SURGERY with CHG Soap.   2. If you chose to wash your hair, wash your hair first as usual with your normal shampoo.  3. After you shampoo, rinse your hair and body thoroughly to remove the shampoo.  4. Use CHG as you would  any other liquid soap. You can apply CHG directly to the skin and wash gently with a scrungie or a clean washcloth.   5. Apply the CHG Soap to your body ONLY FROM THE NECK DOWN.  Do not use on open wounds or open sores. Avoid contact with your eyes, ears, mouth and genitals (private parts). Wash Face and genitals (private parts)  with your normal soap.   6. Wash thoroughly, paying special attention to the area where your surgery will be performed.  7. Thoroughly rinse your body with warm water from the neck down.  8. DO NOT shower/wash with your normal soap after using and rinsing off the CHG Soap.  9. Pat yourself dry with a CLEAN TOWEL.  10. Wear CLEAN PAJAMAS to bed the night before surgery  11. Place CLEAN SHEETS on your bed the night of your first shower and DO NOT SLEEP WITH PETS.   Day of  Surgery: Wear Clean/Comfortable clothing the morning of surgery Do not apply any deodorants/lotions.   Remember to brush your teeth WITH YOUR REGULAR TOOTHPASTE.   Please read over the following fact sheets that you were given.

## 2020-08-24 ENCOUNTER — Other Ambulatory Visit (HOSPITAL_COMMUNITY): Payer: Medicare Other

## 2020-08-24 ENCOUNTER — Ambulatory Visit: Payer: Self-pay | Admitting: General Surgery

## 2020-08-24 ENCOUNTER — Inpatient Hospital Stay (HOSPITAL_COMMUNITY)
Admission: RE | Admit: 2020-08-24 | Discharge: 2020-08-24 | Disposition: A | Discharge status: Home / Self Care | Payer: Medicare Other | Source: Ambulatory Visit

## 2020-08-27 ENCOUNTER — Ambulatory Visit: Admit: 2020-08-27 | Payer: Medicare Other | Admitting: General Surgery

## 2020-08-27 SURGERY — REPAIR, HERNIA, INGUINAL, LAPAROSCOPIC
Anesthesia: General | Laterality: Left

## 2020-09-08 DIAGNOSIS — M810 Age-related osteoporosis without current pathological fracture: Secondary | ICD-10-CM | POA: Diagnosis not present

## 2020-09-08 DIAGNOSIS — M6281 Muscle weakness (generalized): Secondary | ICD-10-CM | POA: Diagnosis not present

## 2020-09-08 DIAGNOSIS — M25551 Pain in right hip: Secondary | ICD-10-CM | POA: Diagnosis not present

## 2020-09-08 DIAGNOSIS — M9905 Segmental and somatic dysfunction of pelvic region: Secondary | ICD-10-CM | POA: Diagnosis not present

## 2020-09-08 DIAGNOSIS — M9904 Segmental and somatic dysfunction of sacral region: Secondary | ICD-10-CM | POA: Diagnosis not present

## 2020-09-08 DIAGNOSIS — M9906 Segmental and somatic dysfunction of lower extremity: Secondary | ICD-10-CM | POA: Diagnosis not present

## 2020-09-08 DIAGNOSIS — M353 Polymyalgia rheumatica: Secondary | ICD-10-CM | POA: Diagnosis not present

## 2020-09-08 DIAGNOSIS — M9903 Segmental and somatic dysfunction of lumbar region: Secondary | ICD-10-CM | POA: Diagnosis not present

## 2020-09-09 ENCOUNTER — Other Ambulatory Visit: Payer: Self-pay | Admitting: Sports Medicine

## 2020-09-09 ENCOUNTER — Other Ambulatory Visit: Payer: Self-pay

## 2020-09-09 ENCOUNTER — Ambulatory Visit
Admission: RE | Admit: 2020-09-09 | Discharge: 2020-09-09 | Disposition: A | Discharge status: Home / Self Care | Payer: Medicare Other | Source: Ambulatory Visit | Attending: Sports Medicine | Admitting: Sports Medicine

## 2020-09-09 DIAGNOSIS — M25551 Pain in right hip: Secondary | ICD-10-CM | POA: Diagnosis not present

## 2020-09-15 DIAGNOSIS — M9906 Segmental and somatic dysfunction of lower extremity: Secondary | ICD-10-CM | POA: Diagnosis not present

## 2020-09-15 DIAGNOSIS — M25551 Pain in right hip: Secondary | ICD-10-CM | POA: Diagnosis not present

## 2020-09-15 DIAGNOSIS — M9905 Segmental and somatic dysfunction of pelvic region: Secondary | ICD-10-CM | POA: Diagnosis not present

## 2020-09-15 DIAGNOSIS — M9903 Segmental and somatic dysfunction of lumbar region: Secondary | ICD-10-CM | POA: Diagnosis not present

## 2020-09-15 DIAGNOSIS — M6281 Muscle weakness (generalized): Secondary | ICD-10-CM | POA: Diagnosis not present

## 2020-09-15 DIAGNOSIS — M353 Polymyalgia rheumatica: Secondary | ICD-10-CM | POA: Diagnosis not present

## 2020-09-15 DIAGNOSIS — M545 Low back pain, unspecified: Secondary | ICD-10-CM | POA: Diagnosis not present

## 2020-09-15 DIAGNOSIS — M9904 Segmental and somatic dysfunction of sacral region: Secondary | ICD-10-CM | POA: Diagnosis not present

## 2020-09-15 DIAGNOSIS — M4306 Spondylolysis, lumbar region: Secondary | ICD-10-CM | POA: Diagnosis not present

## 2020-09-18 DIAGNOSIS — M4306 Spondylolysis, lumbar region: Secondary | ICD-10-CM | POA: Diagnosis not present

## 2020-09-18 DIAGNOSIS — M9906 Segmental and somatic dysfunction of lower extremity: Secondary | ICD-10-CM | POA: Diagnosis not present

## 2020-09-18 DIAGNOSIS — M9903 Segmental and somatic dysfunction of lumbar region: Secondary | ICD-10-CM | POA: Diagnosis not present

## 2020-09-18 DIAGNOSIS — M545 Low back pain, unspecified: Secondary | ICD-10-CM | POA: Diagnosis not present

## 2020-09-18 DIAGNOSIS — M25551 Pain in right hip: Secondary | ICD-10-CM | POA: Diagnosis not present

## 2020-09-18 DIAGNOSIS — M9905 Segmental and somatic dysfunction of pelvic region: Secondary | ICD-10-CM | POA: Diagnosis not present

## 2020-09-23 DIAGNOSIS — M810 Age-related osteoporosis without current pathological fracture: Secondary | ICD-10-CM | POA: Diagnosis not present

## 2020-09-23 DIAGNOSIS — M353 Polymyalgia rheumatica: Secondary | ICD-10-CM | POA: Diagnosis not present

## 2020-09-23 DIAGNOSIS — M9906 Segmental and somatic dysfunction of lower extremity: Secondary | ICD-10-CM | POA: Diagnosis not present

## 2020-09-23 DIAGNOSIS — M9905 Segmental and somatic dysfunction of pelvic region: Secondary | ICD-10-CM | POA: Diagnosis not present

## 2020-09-23 DIAGNOSIS — M9903 Segmental and somatic dysfunction of lumbar region: Secondary | ICD-10-CM | POA: Diagnosis not present

## 2020-09-23 DIAGNOSIS — M9904 Segmental and somatic dysfunction of sacral region: Secondary | ICD-10-CM | POA: Diagnosis not present

## 2020-09-23 DIAGNOSIS — M25551 Pain in right hip: Secondary | ICD-10-CM | POA: Diagnosis not present

## 2020-09-23 DIAGNOSIS — M6281 Muscle weakness (generalized): Secondary | ICD-10-CM | POA: Diagnosis not present

## 2020-09-28 DIAGNOSIS — E039 Hypothyroidism, unspecified: Secondary | ICD-10-CM | POA: Diagnosis not present

## 2020-09-28 DIAGNOSIS — M353 Polymyalgia rheumatica: Secondary | ICD-10-CM | POA: Diagnosis not present

## 2020-09-28 DIAGNOSIS — Z125 Encounter for screening for malignant neoplasm of prostate: Secondary | ICD-10-CM | POA: Diagnosis not present

## 2020-09-28 DIAGNOSIS — K4090 Unilateral inguinal hernia, without obstruction or gangrene, not specified as recurrent: Secondary | ICD-10-CM | POA: Diagnosis not present

## 2020-09-28 DIAGNOSIS — Z0000 Encounter for general adult medical examination without abnormal findings: Secondary | ICD-10-CM | POA: Diagnosis not present

## 2020-09-28 DIAGNOSIS — M810 Age-related osteoporosis without current pathological fracture: Secondary | ICD-10-CM | POA: Diagnosis not present

## 2020-09-28 DIAGNOSIS — Z1389 Encounter for screening for other disorder: Secondary | ICD-10-CM | POA: Diagnosis not present

## 2020-10-02 DIAGNOSIS — M9903 Segmental and somatic dysfunction of lumbar region: Secondary | ICD-10-CM | POA: Diagnosis not present

## 2020-10-02 DIAGNOSIS — M9905 Segmental and somatic dysfunction of pelvic region: Secondary | ICD-10-CM | POA: Diagnosis not present

## 2020-10-02 DIAGNOSIS — M9906 Segmental and somatic dysfunction of lower extremity: Secondary | ICD-10-CM | POA: Diagnosis not present

## 2020-10-02 DIAGNOSIS — M545 Low back pain, unspecified: Secondary | ICD-10-CM | POA: Diagnosis not present

## 2020-10-02 DIAGNOSIS — M4306 Spondylolysis, lumbar region: Secondary | ICD-10-CM | POA: Diagnosis not present

## 2020-10-02 DIAGNOSIS — M25551 Pain in right hip: Secondary | ICD-10-CM | POA: Diagnosis not present

## 2020-10-07 DIAGNOSIS — M353 Polymyalgia rheumatica: Secondary | ICD-10-CM | POA: Diagnosis not present

## 2020-10-07 DIAGNOSIS — M9903 Segmental and somatic dysfunction of lumbar region: Secondary | ICD-10-CM | POA: Diagnosis not present

## 2020-10-07 DIAGNOSIS — K219 Gastro-esophageal reflux disease without esophagitis: Secondary | ICD-10-CM | POA: Diagnosis not present

## 2020-10-07 DIAGNOSIS — M25551 Pain in right hip: Secondary | ICD-10-CM | POA: Diagnosis not present

## 2020-10-07 DIAGNOSIS — M1990 Unspecified osteoarthritis, unspecified site: Secondary | ICD-10-CM | POA: Diagnosis not present

## 2020-10-07 DIAGNOSIS — M6281 Muscle weakness (generalized): Secondary | ICD-10-CM | POA: Diagnosis not present

## 2020-10-07 DIAGNOSIS — M9906 Segmental and somatic dysfunction of lower extremity: Secondary | ICD-10-CM | POA: Diagnosis not present

## 2020-10-07 DIAGNOSIS — M9904 Segmental and somatic dysfunction of sacral region: Secondary | ICD-10-CM | POA: Diagnosis not present

## 2020-10-07 DIAGNOSIS — M25519 Pain in unspecified shoulder: Secondary | ICD-10-CM | POA: Diagnosis not present

## 2020-10-07 DIAGNOSIS — R531 Weakness: Secondary | ICD-10-CM | POA: Diagnosis not present

## 2020-10-07 DIAGNOSIS — Z7952 Long term (current) use of systemic steroids: Secondary | ICD-10-CM | POA: Diagnosis not present

## 2020-10-07 DIAGNOSIS — M810 Age-related osteoporosis without current pathological fracture: Secondary | ICD-10-CM | POA: Diagnosis not present

## 2020-10-16 DIAGNOSIS — M4306 Spondylolysis, lumbar region: Secondary | ICD-10-CM | POA: Diagnosis not present

## 2020-10-16 DIAGNOSIS — M9905 Segmental and somatic dysfunction of pelvic region: Secondary | ICD-10-CM | POA: Diagnosis not present

## 2020-10-16 DIAGNOSIS — M545 Low back pain, unspecified: Secondary | ICD-10-CM | POA: Diagnosis not present

## 2020-10-16 DIAGNOSIS — M9903 Segmental and somatic dysfunction of lumbar region: Secondary | ICD-10-CM | POA: Diagnosis not present

## 2020-10-16 DIAGNOSIS — M9906 Segmental and somatic dysfunction of lower extremity: Secondary | ICD-10-CM | POA: Diagnosis not present

## 2020-10-16 DIAGNOSIS — M25551 Pain in right hip: Secondary | ICD-10-CM | POA: Diagnosis not present

## 2020-10-19 DIAGNOSIS — M25551 Pain in right hip: Secondary | ICD-10-CM | POA: Diagnosis not present

## 2020-10-19 DIAGNOSIS — M79662 Pain in left lower leg: Secondary | ICD-10-CM | POA: Diagnosis not present

## 2020-10-19 DIAGNOSIS — M545 Low back pain, unspecified: Secondary | ICD-10-CM | POA: Diagnosis not present

## 2020-10-19 DIAGNOSIS — M9905 Segmental and somatic dysfunction of pelvic region: Secondary | ICD-10-CM | POA: Diagnosis not present

## 2020-10-19 DIAGNOSIS — M9904 Segmental and somatic dysfunction of sacral region: Secondary | ICD-10-CM | POA: Diagnosis not present

## 2020-10-19 DIAGNOSIS — M4306 Spondylolysis, lumbar region: Secondary | ICD-10-CM | POA: Diagnosis not present

## 2020-10-19 DIAGNOSIS — M9906 Segmental and somatic dysfunction of lower extremity: Secondary | ICD-10-CM | POA: Diagnosis not present

## 2020-10-19 DIAGNOSIS — M353 Polymyalgia rheumatica: Secondary | ICD-10-CM | POA: Diagnosis not present

## 2020-10-19 DIAGNOSIS — M9903 Segmental and somatic dysfunction of lumbar region: Secondary | ICD-10-CM | POA: Diagnosis not present

## 2020-10-30 DIAGNOSIS — M9905 Segmental and somatic dysfunction of pelvic region: Secondary | ICD-10-CM | POA: Diagnosis not present

## 2020-10-30 DIAGNOSIS — M25551 Pain in right hip: Secondary | ICD-10-CM | POA: Diagnosis not present

## 2020-10-30 DIAGNOSIS — M545 Low back pain, unspecified: Secondary | ICD-10-CM | POA: Diagnosis not present

## 2020-10-30 DIAGNOSIS — M9903 Segmental and somatic dysfunction of lumbar region: Secondary | ICD-10-CM | POA: Diagnosis not present

## 2020-10-30 DIAGNOSIS — M4306 Spondylolysis, lumbar region: Secondary | ICD-10-CM | POA: Diagnosis not present

## 2020-10-30 DIAGNOSIS — M9906 Segmental and somatic dysfunction of lower extremity: Secondary | ICD-10-CM | POA: Diagnosis not present

## 2020-11-04 DIAGNOSIS — M25551 Pain in right hip: Secondary | ICD-10-CM | POA: Diagnosis not present

## 2020-11-04 DIAGNOSIS — M9904 Segmental and somatic dysfunction of sacral region: Secondary | ICD-10-CM | POA: Diagnosis not present

## 2020-11-04 DIAGNOSIS — M9906 Segmental and somatic dysfunction of lower extremity: Secondary | ICD-10-CM | POA: Diagnosis not present

## 2020-11-04 DIAGNOSIS — M6281 Muscle weakness (generalized): Secondary | ICD-10-CM | POA: Diagnosis not present

## 2020-11-04 DIAGNOSIS — M4306 Spondylolysis, lumbar region: Secondary | ICD-10-CM | POA: Diagnosis not present

## 2020-11-04 DIAGNOSIS — M9905 Segmental and somatic dysfunction of pelvic region: Secondary | ICD-10-CM | POA: Diagnosis not present

## 2020-11-04 DIAGNOSIS — M545 Low back pain, unspecified: Secondary | ICD-10-CM | POA: Diagnosis not present

## 2020-11-04 DIAGNOSIS — M353 Polymyalgia rheumatica: Secondary | ICD-10-CM | POA: Diagnosis not present

## 2020-11-04 DIAGNOSIS — M9903 Segmental and somatic dysfunction of lumbar region: Secondary | ICD-10-CM | POA: Diagnosis not present

## 2020-11-16 DIAGNOSIS — L820 Inflamed seborrheic keratosis: Secondary | ICD-10-CM | POA: Diagnosis not present

## 2020-11-16 DIAGNOSIS — L570 Actinic keratosis: Secondary | ICD-10-CM | POA: Diagnosis not present

## 2020-11-16 DIAGNOSIS — Z85828 Personal history of other malignant neoplasm of skin: Secondary | ICD-10-CM | POA: Diagnosis not present

## 2020-11-16 DIAGNOSIS — D485 Neoplasm of uncertain behavior of skin: Secondary | ICD-10-CM | POA: Diagnosis not present

## 2020-11-16 DIAGNOSIS — D0461 Carcinoma in situ of skin of right upper limb, including shoulder: Secondary | ICD-10-CM | POA: Diagnosis not present

## 2020-11-16 DIAGNOSIS — L218 Other seborrheic dermatitis: Secondary | ICD-10-CM | POA: Diagnosis not present

## 2020-11-18 DIAGNOSIS — M9905 Segmental and somatic dysfunction of pelvic region: Secondary | ICD-10-CM | POA: Diagnosis not present

## 2020-11-18 DIAGNOSIS — M545 Low back pain, unspecified: Secondary | ICD-10-CM | POA: Diagnosis not present

## 2020-11-18 DIAGNOSIS — R1010 Upper abdominal pain, unspecified: Secondary | ICD-10-CM | POA: Diagnosis not present

## 2020-11-18 DIAGNOSIS — M9904 Segmental and somatic dysfunction of sacral region: Secondary | ICD-10-CM | POA: Diagnosis not present

## 2020-11-18 DIAGNOSIS — M25551 Pain in right hip: Secondary | ICD-10-CM | POA: Diagnosis not present

## 2020-11-18 DIAGNOSIS — M9906 Segmental and somatic dysfunction of lower extremity: Secondary | ICD-10-CM | POA: Diagnosis not present

## 2020-11-18 DIAGNOSIS — M9903 Segmental and somatic dysfunction of lumbar region: Secondary | ICD-10-CM | POA: Diagnosis not present

## 2020-11-18 DIAGNOSIS — M9909 Segmental and somatic dysfunction of abdomen and other regions: Secondary | ICD-10-CM | POA: Diagnosis not present

## 2020-12-02 DIAGNOSIS — M6281 Muscle weakness (generalized): Secondary | ICD-10-CM | POA: Diagnosis not present

## 2020-12-02 DIAGNOSIS — M545 Low back pain, unspecified: Secondary | ICD-10-CM | POA: Diagnosis not present

## 2020-12-02 DIAGNOSIS — M47816 Spondylosis without myelopathy or radiculopathy, lumbar region: Secondary | ICD-10-CM | POA: Diagnosis not present

## 2020-12-02 DIAGNOSIS — K460 Unspecified abdominal hernia with obstruction, without gangrene: Secondary | ICD-10-CM | POA: Diagnosis not present

## 2020-12-02 DIAGNOSIS — M810 Age-related osteoporosis without current pathological fracture: Secondary | ICD-10-CM | POA: Diagnosis not present

## 2020-12-25 DIAGNOSIS — M810 Age-related osteoporosis without current pathological fracture: Secondary | ICD-10-CM | POA: Diagnosis not present

## 2020-12-25 DIAGNOSIS — M6281 Muscle weakness (generalized): Secondary | ICD-10-CM | POA: Diagnosis not present

## 2020-12-25 DIAGNOSIS — E039 Hypothyroidism, unspecified: Secondary | ICD-10-CM | POA: Diagnosis not present

## 2020-12-25 DIAGNOSIS — M353 Polymyalgia rheumatica: Secondary | ICD-10-CM | POA: Diagnosis not present

## 2020-12-28 DIAGNOSIS — M810 Age-related osteoporosis without current pathological fracture: Secondary | ICD-10-CM | POA: Diagnosis not present

## 2021-01-01 DIAGNOSIS — M4306 Spondylolysis, lumbar region: Secondary | ICD-10-CM | POA: Diagnosis not present

## 2021-01-01 DIAGNOSIS — M9905 Segmental and somatic dysfunction of pelvic region: Secondary | ICD-10-CM | POA: Diagnosis not present

## 2021-01-01 DIAGNOSIS — M9903 Segmental and somatic dysfunction of lumbar region: Secondary | ICD-10-CM | POA: Diagnosis not present

## 2021-01-01 DIAGNOSIS — M9906 Segmental and somatic dysfunction of lower extremity: Secondary | ICD-10-CM | POA: Diagnosis not present

## 2021-01-01 DIAGNOSIS — M545 Low back pain, unspecified: Secondary | ICD-10-CM | POA: Diagnosis not present

## 2021-01-01 DIAGNOSIS — M25551 Pain in right hip: Secondary | ICD-10-CM | POA: Diagnosis not present

## 2021-01-13 DIAGNOSIS — M25551 Pain in right hip: Secondary | ICD-10-CM | POA: Diagnosis not present

## 2021-01-13 DIAGNOSIS — M9903 Segmental and somatic dysfunction of lumbar region: Secondary | ICD-10-CM | POA: Diagnosis not present

## 2021-01-13 DIAGNOSIS — M545 Low back pain, unspecified: Secondary | ICD-10-CM | POA: Diagnosis not present

## 2021-01-13 DIAGNOSIS — M6281 Muscle weakness (generalized): Secondary | ICD-10-CM | POA: Diagnosis not present

## 2021-01-13 DIAGNOSIS — M9904 Segmental and somatic dysfunction of sacral region: Secondary | ICD-10-CM | POA: Diagnosis not present

## 2021-01-13 DIAGNOSIS — M9906 Segmental and somatic dysfunction of lower extremity: Secondary | ICD-10-CM | POA: Diagnosis not present

## 2021-01-13 DIAGNOSIS — M9905 Segmental and somatic dysfunction of pelvic region: Secondary | ICD-10-CM | POA: Diagnosis not present

## 2021-01-13 DIAGNOSIS — M353 Polymyalgia rheumatica: Secondary | ICD-10-CM | POA: Diagnosis not present

## 2021-01-13 DIAGNOSIS — M4306 Spondylolysis, lumbar region: Secondary | ICD-10-CM | POA: Diagnosis not present

## 2021-01-13 DIAGNOSIS — M47816 Spondylosis without myelopathy or radiculopathy, lumbar region: Secondary | ICD-10-CM | POA: Diagnosis not present

## 2021-01-25 DIAGNOSIS — Z85828 Personal history of other malignant neoplasm of skin: Secondary | ICD-10-CM | POA: Diagnosis not present

## 2021-01-25 DIAGNOSIS — L245 Irritant contact dermatitis due to other chemical products: Secondary | ICD-10-CM | POA: Diagnosis not present

## 2021-01-25 DIAGNOSIS — D485 Neoplasm of uncertain behavior of skin: Secondary | ICD-10-CM | POA: Diagnosis not present

## 2021-01-25 DIAGNOSIS — D0462 Carcinoma in situ of skin of left upper limb, including shoulder: Secondary | ICD-10-CM | POA: Diagnosis not present

## 2021-02-12 DIAGNOSIS — M25511 Pain in right shoulder: Secondary | ICD-10-CM | POA: Diagnosis not present

## 2021-02-12 DIAGNOSIS — M19011 Primary osteoarthritis, right shoulder: Secondary | ICD-10-CM | POA: Diagnosis not present

## 2021-02-12 DIAGNOSIS — M9907 Segmental and somatic dysfunction of upper extremity: Secondary | ICD-10-CM | POA: Diagnosis not present

## 2021-02-12 DIAGNOSIS — M9902 Segmental and somatic dysfunction of thoracic region: Secondary | ICD-10-CM | POA: Diagnosis not present

## 2021-02-12 DIAGNOSIS — M9901 Segmental and somatic dysfunction of cervical region: Secondary | ICD-10-CM | POA: Diagnosis not present

## 2021-02-17 DIAGNOSIS — M353 Polymyalgia rheumatica: Secondary | ICD-10-CM | POA: Diagnosis not present

## 2021-02-17 DIAGNOSIS — M1990 Unspecified osteoarthritis, unspecified site: Secondary | ICD-10-CM | POA: Diagnosis not present

## 2021-02-17 DIAGNOSIS — Z7952 Long term (current) use of systemic steroids: Secondary | ICD-10-CM | POA: Diagnosis not present

## 2021-02-17 DIAGNOSIS — M25519 Pain in unspecified shoulder: Secondary | ICD-10-CM | POA: Diagnosis not present

## 2021-02-17 DIAGNOSIS — K219 Gastro-esophageal reflux disease without esophagitis: Secondary | ICD-10-CM | POA: Diagnosis not present

## 2021-02-17 DIAGNOSIS — M810 Age-related osteoporosis without current pathological fracture: Secondary | ICD-10-CM | POA: Diagnosis not present

## 2021-03-05 DIAGNOSIS — M9907 Segmental and somatic dysfunction of upper extremity: Secondary | ICD-10-CM | POA: Diagnosis not present

## 2021-03-05 DIAGNOSIS — M9908 Segmental and somatic dysfunction of rib cage: Secondary | ICD-10-CM | POA: Diagnosis not present

## 2021-03-05 DIAGNOSIS — M25511 Pain in right shoulder: Secondary | ICD-10-CM | POA: Diagnosis not present

## 2021-03-05 DIAGNOSIS — M19011 Primary osteoarthritis, right shoulder: Secondary | ICD-10-CM | POA: Diagnosis not present

## 2021-03-05 DIAGNOSIS — M9901 Segmental and somatic dysfunction of cervical region: Secondary | ICD-10-CM | POA: Diagnosis not present

## 2021-03-17 DIAGNOSIS — M545 Low back pain, unspecified: Secondary | ICD-10-CM | POA: Diagnosis not present

## 2021-03-17 DIAGNOSIS — M25511 Pain in right shoulder: Secondary | ICD-10-CM | POA: Diagnosis not present

## 2021-03-17 DIAGNOSIS — M19011 Primary osteoarthritis, right shoulder: Secondary | ICD-10-CM | POA: Diagnosis not present

## 2021-03-17 DIAGNOSIS — M9907 Segmental and somatic dysfunction of upper extremity: Secondary | ICD-10-CM | POA: Diagnosis not present

## 2021-03-17 DIAGNOSIS — M9908 Segmental and somatic dysfunction of rib cage: Secondary | ICD-10-CM | POA: Diagnosis not present

## 2021-03-17 DIAGNOSIS — M9906 Segmental and somatic dysfunction of lower extremity: Secondary | ICD-10-CM | POA: Diagnosis not present

## 2021-03-24 DIAGNOSIS — H16223 Keratoconjunctivitis sicca, not specified as Sjogren's, bilateral: Secondary | ICD-10-CM | POA: Diagnosis not present

## 2021-03-26 DIAGNOSIS — M25511 Pain in right shoulder: Secondary | ICD-10-CM | POA: Diagnosis not present

## 2021-03-26 DIAGNOSIS — M353 Polymyalgia rheumatica: Secondary | ICD-10-CM | POA: Diagnosis not present

## 2021-03-26 DIAGNOSIS — M19011 Primary osteoarthritis, right shoulder: Secondary | ICD-10-CM | POA: Diagnosis not present

## 2021-04-07 DIAGNOSIS — M9904 Segmental and somatic dysfunction of sacral region: Secondary | ICD-10-CM | POA: Diagnosis not present

## 2021-04-07 DIAGNOSIS — M25551 Pain in right hip: Secondary | ICD-10-CM | POA: Diagnosis not present

## 2021-04-07 DIAGNOSIS — M9903 Segmental and somatic dysfunction of lumbar region: Secondary | ICD-10-CM | POA: Diagnosis not present

## 2021-04-07 DIAGNOSIS — M9906 Segmental and somatic dysfunction of lower extremity: Secondary | ICD-10-CM | POA: Diagnosis not present

## 2021-04-07 DIAGNOSIS — M4306 Spondylolysis, lumbar region: Secondary | ICD-10-CM | POA: Diagnosis not present

## 2021-04-07 DIAGNOSIS — M545 Low back pain, unspecified: Secondary | ICD-10-CM | POA: Diagnosis not present

## 2021-04-07 DIAGNOSIS — M6281 Muscle weakness (generalized): Secondary | ICD-10-CM | POA: Diagnosis not present

## 2021-04-07 DIAGNOSIS — K460 Unspecified abdominal hernia with obstruction, without gangrene: Secondary | ICD-10-CM | POA: Diagnosis not present

## 2021-04-07 DIAGNOSIS — M353 Polymyalgia rheumatica: Secondary | ICD-10-CM | POA: Diagnosis not present

## 2021-04-07 DIAGNOSIS — M9905 Segmental and somatic dysfunction of pelvic region: Secondary | ICD-10-CM | POA: Diagnosis not present

## 2021-04-08 IMAGING — CR DG TIBIA/FIBULA 2V*L*
4 series · 4 of 4 positions shown · non-contrast
Comparison: None.

CLINICAL DATA: Lower extremity pain and swelling

EXAM:
LEFT TIBIA AND FIBULA - 2 VIEW

[x tib-fib ap left (1 of 4)]
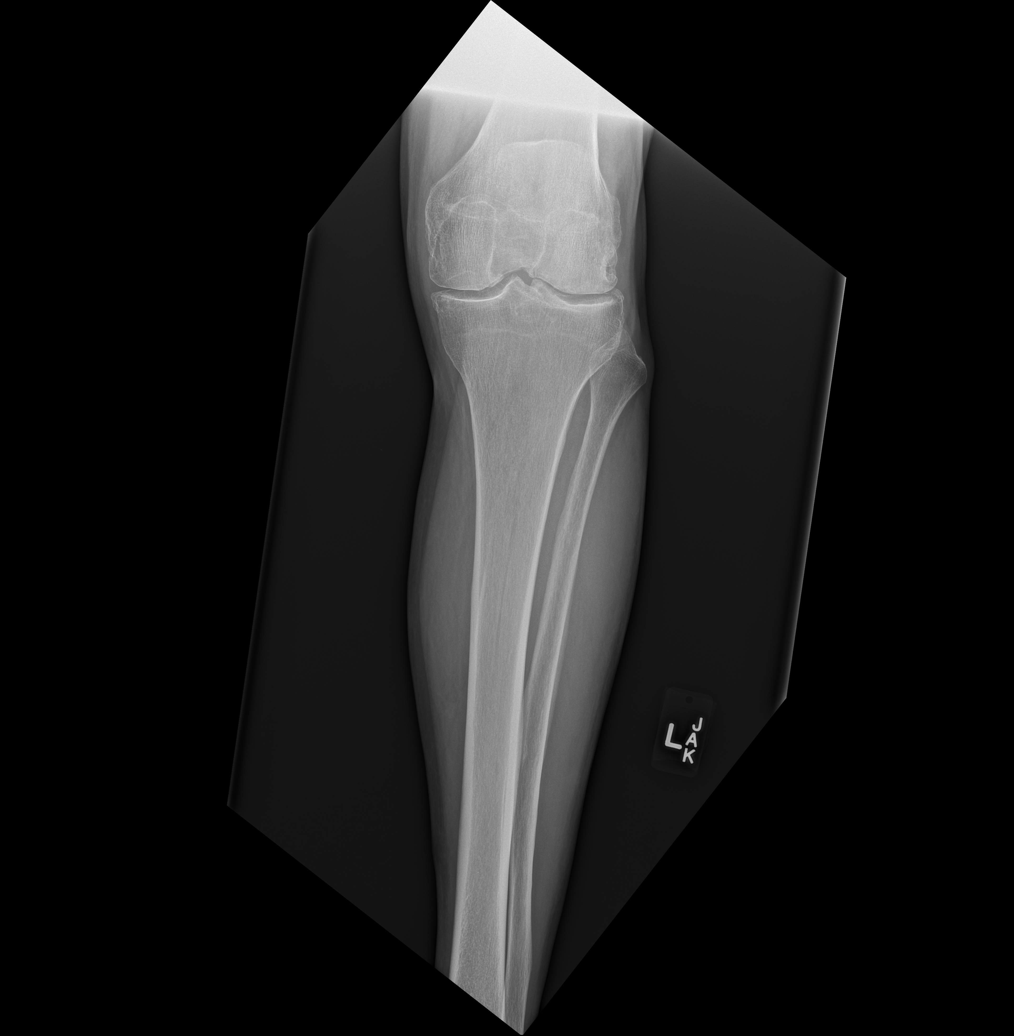

[x tib-fib ap left (2 of 4)]
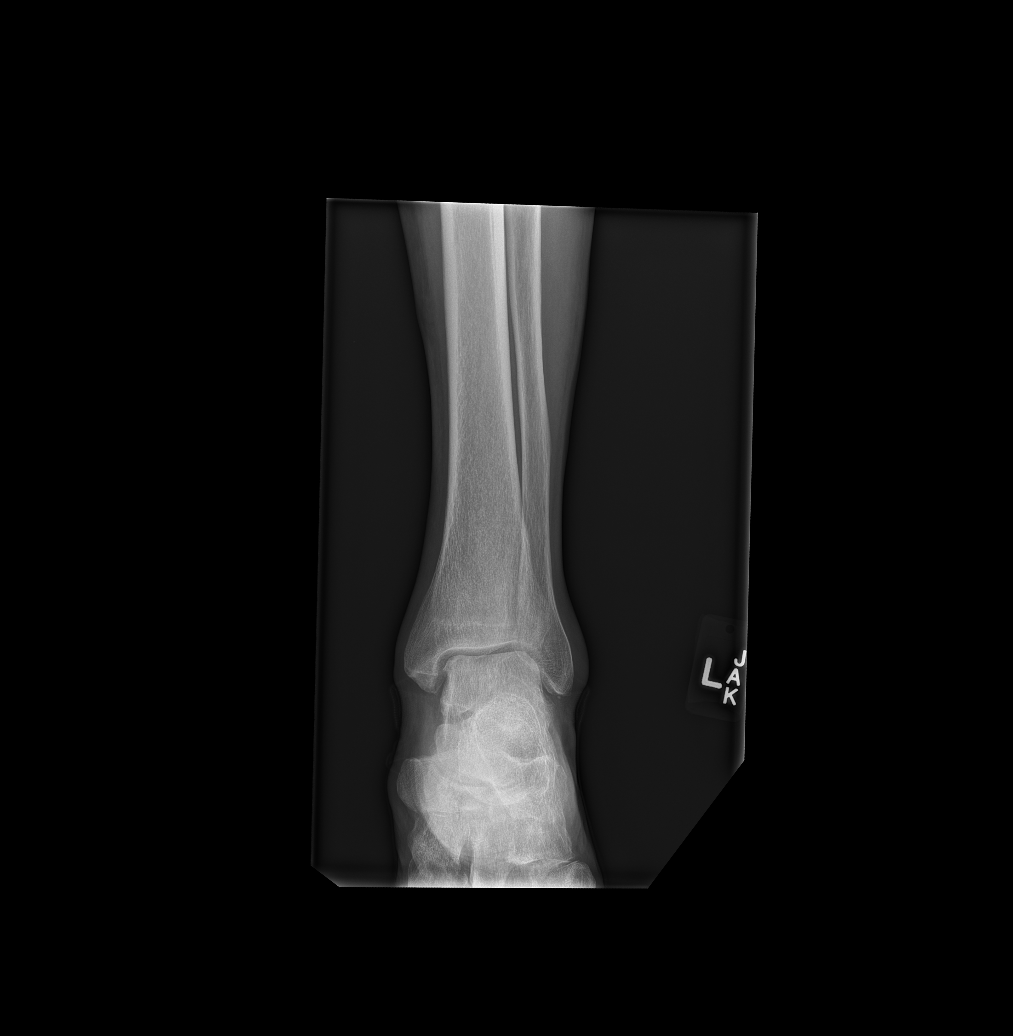

[x tib-fib ap left (3 of 4)]
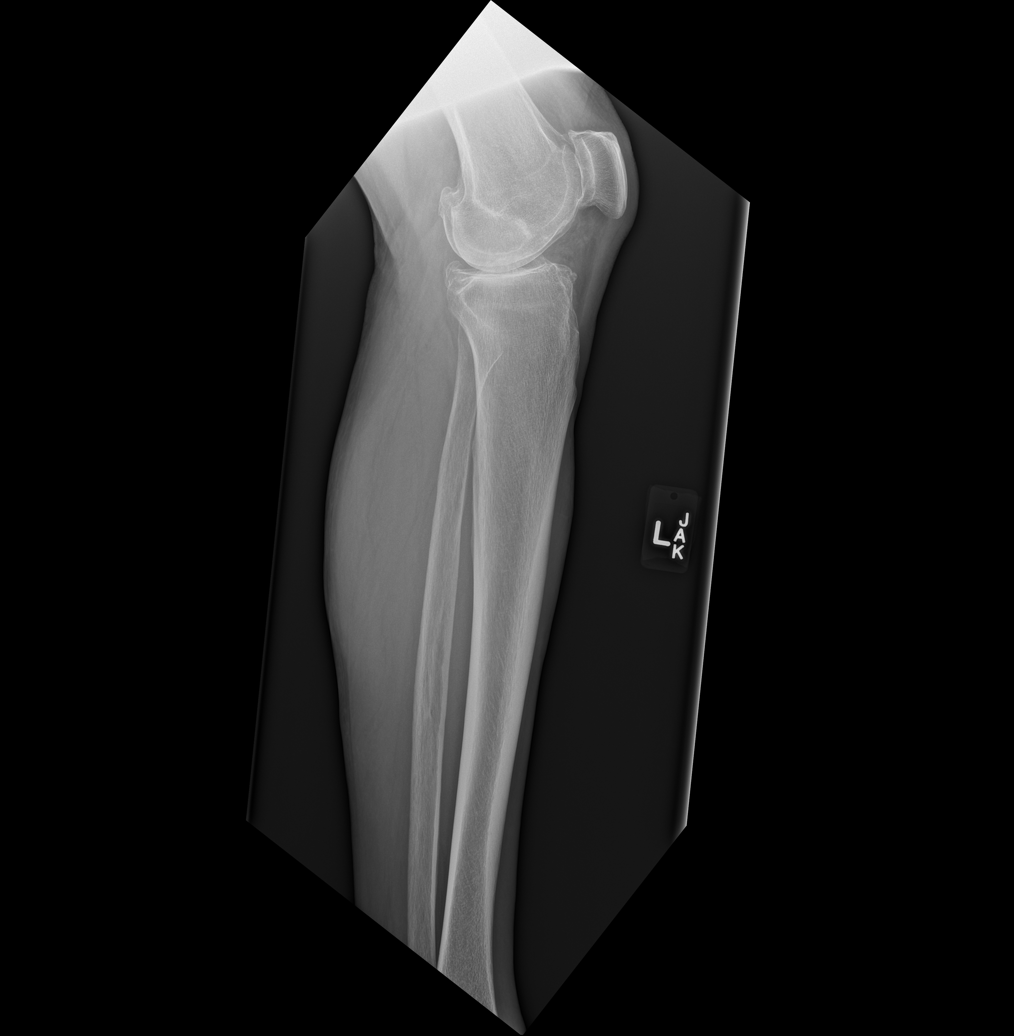

[x tib-fib ap left (4 of 4)]
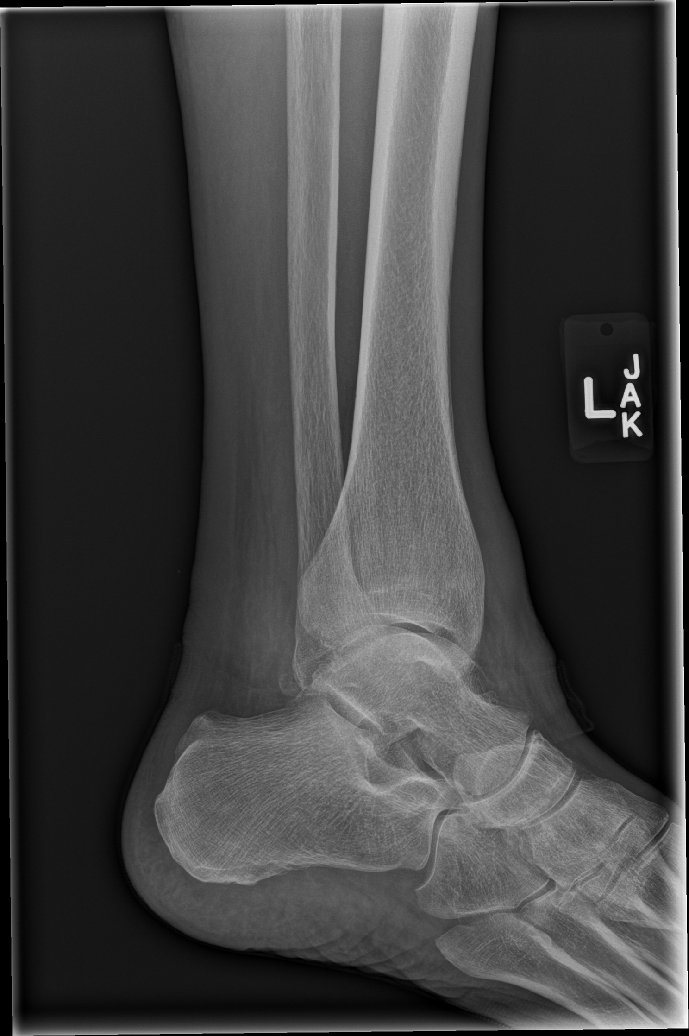

[4 of 4 positions shown; findings below may reference images not displayed]

FINDINGS: Frontal and lateral views were obtained. There is no appreciable
fracture or dislocation. No abnormal periosteal reaction. There is
osteoarthritic change in the knee joint region. No knee joint
effusion. Ankle joint appears grossly unremarkable.
IMPRESSION: No fracture or dislocation. Osteoarthritic change in the knee joint
region noted.

## 2021-05-05 DIAGNOSIS — M9906 Segmental and somatic dysfunction of lower extremity: Secondary | ICD-10-CM | POA: Diagnosis not present

## 2021-05-05 DIAGNOSIS — M545 Low back pain, unspecified: Secondary | ICD-10-CM | POA: Diagnosis not present

## 2021-05-05 DIAGNOSIS — M25511 Pain in right shoulder: Secondary | ICD-10-CM | POA: Diagnosis not present

## 2021-05-05 DIAGNOSIS — M9907 Segmental and somatic dysfunction of upper extremity: Secondary | ICD-10-CM | POA: Diagnosis not present

## 2021-05-31 DIAGNOSIS — Z85828 Personal history of other malignant neoplasm of skin: Secondary | ICD-10-CM | POA: Diagnosis not present

## 2021-05-31 DIAGNOSIS — L570 Actinic keratosis: Secondary | ICD-10-CM | POA: Diagnosis not present

## 2021-06-02 DIAGNOSIS — M9907 Segmental and somatic dysfunction of upper extremity: Secondary | ICD-10-CM | POA: Diagnosis not present

## 2021-06-02 DIAGNOSIS — M25511 Pain in right shoulder: Secondary | ICD-10-CM | POA: Diagnosis not present

## 2021-06-02 DIAGNOSIS — M9901 Segmental and somatic dysfunction of cervical region: Secondary | ICD-10-CM | POA: Diagnosis not present

## 2021-06-21 DIAGNOSIS — M545 Low back pain, unspecified: Secondary | ICD-10-CM | POA: Diagnosis not present

## 2021-06-21 DIAGNOSIS — M9907 Segmental and somatic dysfunction of upper extremity: Secondary | ICD-10-CM | POA: Diagnosis not present

## 2021-06-21 DIAGNOSIS — M9906 Segmental and somatic dysfunction of lower extremity: Secondary | ICD-10-CM | POA: Diagnosis not present

## 2021-06-21 DIAGNOSIS — M25511 Pain in right shoulder: Secondary | ICD-10-CM | POA: Diagnosis not present

## 2021-06-30 DIAGNOSIS — K4091 Unilateral inguinal hernia, without obstruction or gangrene, recurrent: Secondary | ICD-10-CM | POA: Diagnosis not present

## 2021-07-05 DIAGNOSIS — M9904 Segmental and somatic dysfunction of sacral region: Secondary | ICD-10-CM | POA: Diagnosis not present

## 2021-07-05 DIAGNOSIS — M25552 Pain in left hip: Secondary | ICD-10-CM | POA: Diagnosis not present

## 2021-07-05 DIAGNOSIS — M9906 Segmental and somatic dysfunction of lower extremity: Secondary | ICD-10-CM | POA: Diagnosis not present

## 2021-07-05 DIAGNOSIS — M9907 Segmental and somatic dysfunction of upper extremity: Secondary | ICD-10-CM | POA: Diagnosis not present

## 2021-07-05 DIAGNOSIS — M19011 Primary osteoarthritis, right shoulder: Secondary | ICD-10-CM | POA: Diagnosis not present

## 2021-07-05 DIAGNOSIS — M545 Low back pain, unspecified: Secondary | ICD-10-CM | POA: Diagnosis not present

## 2021-07-05 DIAGNOSIS — M25511 Pain in right shoulder: Secondary | ICD-10-CM | POA: Diagnosis not present

## 2021-07-05 DIAGNOSIS — M79652 Pain in left thigh: Secondary | ICD-10-CM | POA: Diagnosis not present

## 2021-07-12 DIAGNOSIS — R051 Acute cough: Secondary | ICD-10-CM | POA: Diagnosis not present

## 2021-07-12 DIAGNOSIS — U071 COVID-19: Secondary | ICD-10-CM | POA: Diagnosis not present

## 2021-07-19 DIAGNOSIS — M62830 Muscle spasm of back: Secondary | ICD-10-CM | POA: Diagnosis not present

## 2021-07-19 DIAGNOSIS — M25511 Pain in right shoulder: Secondary | ICD-10-CM | POA: Diagnosis not present

## 2021-07-19 DIAGNOSIS — M9902 Segmental and somatic dysfunction of thoracic region: Secondary | ICD-10-CM | POA: Diagnosis not present

## 2021-07-19 DIAGNOSIS — M545 Low back pain, unspecified: Secondary | ICD-10-CM | POA: Diagnosis not present

## 2021-07-19 DIAGNOSIS — M9903 Segmental and somatic dysfunction of lumbar region: Secondary | ICD-10-CM | POA: Diagnosis not present

## 2021-07-19 DIAGNOSIS — M9907 Segmental and somatic dysfunction of upper extremity: Secondary | ICD-10-CM | POA: Diagnosis not present

## 2021-07-19 DIAGNOSIS — M19011 Primary osteoarthritis, right shoulder: Secondary | ICD-10-CM | POA: Diagnosis not present

## 2021-07-19 DIAGNOSIS — M9908 Segmental and somatic dysfunction of rib cage: Secondary | ICD-10-CM | POA: Diagnosis not present

## 2021-07-19 DIAGNOSIS — M9906 Segmental and somatic dysfunction of lower extremity: Secondary | ICD-10-CM | POA: Diagnosis not present

## 2021-07-20 DIAGNOSIS — M85852 Other specified disorders of bone density and structure, left thigh: Secondary | ICD-10-CM | POA: Diagnosis not present

## 2021-07-28 DIAGNOSIS — M810 Age-related osteoporosis without current pathological fracture: Secondary | ICD-10-CM | POA: Diagnosis not present

## 2021-07-28 DIAGNOSIS — Z87310 Personal history of (healed) osteoporosis fracture: Secondary | ICD-10-CM | POA: Diagnosis not present

## 2021-08-04 DIAGNOSIS — M9901 Segmental and somatic dysfunction of cervical region: Secondary | ICD-10-CM | POA: Diagnosis not present

## 2021-08-04 DIAGNOSIS — M9907 Segmental and somatic dysfunction of upper extremity: Secondary | ICD-10-CM | POA: Diagnosis not present

## 2021-08-04 DIAGNOSIS — M810 Age-related osteoporosis without current pathological fracture: Secondary | ICD-10-CM | POA: Diagnosis not present

## 2021-08-04 DIAGNOSIS — M9908 Segmental and somatic dysfunction of rib cage: Secondary | ICD-10-CM | POA: Diagnosis not present

## 2021-08-04 DIAGNOSIS — M25511 Pain in right shoulder: Secondary | ICD-10-CM | POA: Diagnosis not present

## 2021-08-09 DIAGNOSIS — M810 Age-related osteoporosis without current pathological fracture: Secondary | ICD-10-CM | POA: Diagnosis not present

## 2021-08-18 DIAGNOSIS — M25511 Pain in right shoulder: Secondary | ICD-10-CM | POA: Diagnosis not present

## 2021-08-18 DIAGNOSIS — M19011 Primary osteoarthritis, right shoulder: Secondary | ICD-10-CM | POA: Diagnosis not present

## 2021-08-19 DIAGNOSIS — M25511 Pain in right shoulder: Secondary | ICD-10-CM | POA: Diagnosis not present

## 2021-08-25 ENCOUNTER — Other Ambulatory Visit: Payer: Self-pay | Admitting: Orthopedic Surgery

## 2021-08-25 DIAGNOSIS — M19011 Primary osteoarthritis, right shoulder: Secondary | ICD-10-CM

## 2021-09-01 DIAGNOSIS — M19011 Primary osteoarthritis, right shoulder: Secondary | ICD-10-CM | POA: Diagnosis not present

## 2021-09-01 DIAGNOSIS — M9907 Segmental and somatic dysfunction of upper extremity: Secondary | ICD-10-CM | POA: Diagnosis not present

## 2021-09-01 DIAGNOSIS — M25511 Pain in right shoulder: Secondary | ICD-10-CM | POA: Diagnosis not present

## 2021-09-01 DIAGNOSIS — M810 Age-related osteoporosis without current pathological fracture: Secondary | ICD-10-CM | POA: Diagnosis not present

## 2021-09-03 DIAGNOSIS — M25511 Pain in right shoulder: Secondary | ICD-10-CM | POA: Diagnosis not present

## 2021-09-06 DIAGNOSIS — M1990 Unspecified osteoarthritis, unspecified site: Secondary | ICD-10-CM | POA: Diagnosis not present

## 2021-09-06 DIAGNOSIS — K219 Gastro-esophageal reflux disease without esophagitis: Secondary | ICD-10-CM | POA: Diagnosis not present

## 2021-09-06 DIAGNOSIS — M25519 Pain in unspecified shoulder: Secondary | ICD-10-CM | POA: Diagnosis not present

## 2021-09-06 DIAGNOSIS — Z7952 Long term (current) use of systemic steroids: Secondary | ICD-10-CM | POA: Diagnosis not present

## 2021-09-06 DIAGNOSIS — M353 Polymyalgia rheumatica: Secondary | ICD-10-CM | POA: Diagnosis not present

## 2021-09-06 DIAGNOSIS — M810 Age-related osteoporosis without current pathological fracture: Secondary | ICD-10-CM | POA: Diagnosis not present

## 2021-09-07 ENCOUNTER — Other Ambulatory Visit: Payer: Self-pay | Admitting: Internal Medicine

## 2021-09-07 ENCOUNTER — Other Ambulatory Visit: Payer: Self-pay | Admitting: Orthopaedic Surgery

## 2021-09-07 DIAGNOSIS — M19011 Primary osteoarthritis, right shoulder: Secondary | ICD-10-CM

## 2021-09-15 ENCOUNTER — Ambulatory Visit
Admission: RE | Admit: 2021-09-15 | Discharge: 2021-09-15 | Disposition: A | Discharge status: Home / Self Care | Payer: Medicare Other | Source: Ambulatory Visit | Attending: Orthopaedic Surgery | Admitting: Orthopaedic Surgery

## 2021-09-15 ENCOUNTER — Other Ambulatory Visit: Payer: Self-pay

## 2021-09-15 DIAGNOSIS — M19011 Primary osteoarthritis, right shoulder: Secondary | ICD-10-CM

## 2021-09-17 DIAGNOSIS — M25511 Pain in right shoulder: Secondary | ICD-10-CM | POA: Diagnosis not present

## 2021-09-17 DIAGNOSIS — M9907 Segmental and somatic dysfunction of upper extremity: Secondary | ICD-10-CM | POA: Diagnosis not present

## 2021-09-17 DIAGNOSIS — R531 Weakness: Secondary | ICD-10-CM | POA: Diagnosis not present

## 2021-09-17 DIAGNOSIS — M19011 Primary osteoarthritis, right shoulder: Secondary | ICD-10-CM | POA: Diagnosis not present

## 2021-09-23 DIAGNOSIS — M25511 Pain in right shoulder: Secondary | ICD-10-CM | POA: Diagnosis not present

## 2021-09-27 DIAGNOSIS — M25611 Stiffness of right shoulder, not elsewhere classified: Secondary | ICD-10-CM | POA: Diagnosis not present

## 2021-09-27 DIAGNOSIS — M6281 Muscle weakness (generalized): Secondary | ICD-10-CM | POA: Diagnosis not present

## 2021-09-27 DIAGNOSIS — M19011 Primary osteoarthritis, right shoulder: Secondary | ICD-10-CM | POA: Diagnosis not present

## 2021-09-27 DIAGNOSIS — M25511 Pain in right shoulder: Secondary | ICD-10-CM | POA: Diagnosis not present

## 2021-10-04 DIAGNOSIS — E039 Hypothyroidism, unspecified: Secondary | ICD-10-CM | POA: Diagnosis not present

## 2021-10-04 DIAGNOSIS — M810 Age-related osteoporosis without current pathological fracture: Secondary | ICD-10-CM | POA: Diagnosis not present

## 2021-10-04 DIAGNOSIS — M5137 Other intervertebral disc degeneration, lumbosacral region: Secondary | ICD-10-CM | POA: Diagnosis not present

## 2021-10-04 DIAGNOSIS — Z0000 Encounter for general adult medical examination without abnormal findings: Secondary | ICD-10-CM | POA: Diagnosis not present

## 2021-10-04 DIAGNOSIS — K4090 Unilateral inguinal hernia, without obstruction or gangrene, not specified as recurrent: Secondary | ICD-10-CM | POA: Diagnosis not present

## 2021-10-04 DIAGNOSIS — M353 Polymyalgia rheumatica: Secondary | ICD-10-CM | POA: Diagnosis not present

## 2021-10-04 DIAGNOSIS — Z1389 Encounter for screening for other disorder: Secondary | ICD-10-CM | POA: Diagnosis not present

## 2021-10-04 DIAGNOSIS — Z23 Encounter for immunization: Secondary | ICD-10-CM | POA: Diagnosis not present

## 2021-10-04 DIAGNOSIS — Z1211 Encounter for screening for malignant neoplasm of colon: Secondary | ICD-10-CM | POA: Diagnosis not present

## 2021-10-04 DIAGNOSIS — M19011 Primary osteoarthritis, right shoulder: Secondary | ICD-10-CM | POA: Diagnosis not present

## 2021-10-04 DIAGNOSIS — Z125 Encounter for screening for malignant neoplasm of prostate: Secondary | ICD-10-CM | POA: Diagnosis not present

## 2021-10-11 DIAGNOSIS — M25511 Pain in right shoulder: Secondary | ICD-10-CM | POA: Diagnosis not present

## 2021-10-11 DIAGNOSIS — M9907 Segmental and somatic dysfunction of upper extremity: Secondary | ICD-10-CM | POA: Diagnosis not present

## 2021-10-11 DIAGNOSIS — M19011 Primary osteoarthritis, right shoulder: Secondary | ICD-10-CM | POA: Diagnosis not present

## 2021-10-13 ENCOUNTER — Other Ambulatory Visit: Payer: Self-pay

## 2021-10-13 ENCOUNTER — Encounter (HOSPITAL_BASED_OUTPATIENT_CLINIC_OR_DEPARTMENT_OTHER): Payer: Self-pay | Admitting: Orthopaedic Surgery

## 2021-10-13 ENCOUNTER — Encounter (HOSPITAL_BASED_OUTPATIENT_CLINIC_OR_DEPARTMENT_OTHER): Payer: Medicare Other | Attending: Orthopaedic Surgery

## 2021-10-13 DIAGNOSIS — Z01812 Encounter for preprocedural laboratory examination: Secondary | ICD-10-CM | POA: Insufficient documentation

## 2021-10-13 LAB — SURGICAL PCR SCREEN
MRSA, PCR: NEGATIVE
Staphylococcus aureus: NEGATIVE

## 2021-10-13 NOTE — Progress Notes (Signed)

## 2021-10-20 NOTE — Anesthesia Preprocedure Evaluation (Addendum)
Anesthesia Evaluation  ?Patient identified by MRN, date of birth, ID band ?Patient awake ? ? ? ?Reviewed: ?Allergy & Precautions, NPO status , Patient's Chart, lab work & pertinent test results ? ?Airway ?Mallampati: III ? ?TM Distance: >3 FB ?Neck ROM: Full ? ? ? Dental ? ?(+) Teeth Intact, Dental Advisory Given ?  ?Pulmonary ? ?Snores at night, has never had sleep study  ?  ?Pulmonary exam normal ?breath sounds clear to auscultation ? ? ? ? ? ? Cardiovascular ?negative cardio ROS ?Normal cardiovascular exam ?Rhythm:Regular Rate:Normal ? ? ?  ?Neuro/Psych ?negative neurological ROS ? negative psych ROS  ? GI/Hepatic ?negative GI ROS, Neg liver ROS,   ?Endo/Other  ?Hypothyroidism  ? Renal/GU ?negative Renal ROS  ?negative genitourinary ?  ?Musculoskeletal ? ?(+) Arthritis , Osteoarthritis,  DJD R shoulder  ? Abdominal ?  ?Peds ? Hematology ?negative hematology ROS ?(+)   ?Anesthesia Other Findings ? ? Reproductive/Obstetrics ?negative OB ROS ? ?  ? ? ? ? ? ? ? ? ? ? ? ? ? ?  ?  ? ? ? ? ? ? ? ?Anesthesia Physical ?Anesthesia Plan ? ?ASA: 1 ? ?Anesthesia Plan: General and Regional  ? ?Post-op Pain Management: Regional block*  ? ?Induction: Intravenous ? ?PONV Risk Score and Plan: 2 and Ondansetron, Dexamethasone and Treatment may vary due to age or medical condition ? ?Airway Management Planned: Oral ETT ? ?Additional Equipment: None ? ?Intra-op Plan:  ? ?Post-operative Plan: Extubation in OR ? ?Informed Consent: I have reviewed the patients History and Physical, chart, labs and discussed the procedure including the risks, benefits and alternatives for the proposed anesthesia with the patient or authorized representative who has indicated his/her understanding and acceptance.  ? ? ? ?Dental advisory given ? ?Plan Discussed with: CRNA ? ?Anesthesia Plan Comments:   ? ? ? ? ? ?Anesthesia Quick Evaluation ? ?

## 2021-10-20 NOTE — H&P (Signed)
? ? ?PREOPERATIVE H&P ? ?Chief Complaint: djd right shoulder ? ?HPI: ?Greg Santiago is a 76 y.o. male who is scheduled for Procedure(s): ?REVERSE SHOULDER ARTHROPLASTY.  ? ?Patient has a past medical history significant for hypothyroidism.  ? ?The patient is a 76 year old who has had intermittent shoulder pain over the many years.  He had been told he had arthritis many years ago.  He is an avid golfer and wants to stay active.  He is accompanied by his wife.  [place]  He has never had surgery on his right shoulder, but he had a [place] procedure on the left shoulder.  He likes to fly fish.  He is limited by his shoulder.  He has significant crepitus and pain during range of motion ? ?Symptoms are rated as moderate to severe, and have been worsening.  This is significantly impairing activities of daily living.   ? ?Please see clinic note for further details on this patient's care.   ? ?He has elected for surgical management.  ? ?Past Medical History:  ?Diagnosis Date  ? DJD of right shoulder   ? Hypothyroidism   ? Osteoporosis   ? ?Past Surgical History:  ?Procedure Laterality Date  ? KNEE ARTHROSCOPY    ? left shoulder surgery Left   ? THYROIDECTOMY, PARTIAL N/A   ? ?Social History  ? ?Socioeconomic History  ? Marital status: Married  ?  Spouse name: Not on file  ? Number of children: Not on file  ? Years of education: Not on file  ? Highest education level: Not on file  ?Occupational History  ? Occupation: retired  ?Tobacco Use  ? Smoking status: Never  ? Smokeless tobacco: Never  ?Substance and Sexual Activity  ? Alcohol use: Yes  ?  Comment: social  ? Drug use: No  ? Sexual activity: Not on file  ?Other Topics Concern  ? Not on file  ?Social History Narrative  ? Not on file  ? ?Social Determinants of Health  ? ?Financial Resource Strain: Not on file  ?Food Insecurity: Not on file  ?Transportation Needs: Not on file  ?Physical Activity: Not on file  ?Stress: Not on file   ?Social Connections: Not on file  ? ?Family History  ?Problem Relation Age of Onset  ? Diabetes Father   ? ?No Known Allergies ?Prior to Admission medications   ?Medication Sig Start Date End Date Taking? Authorizing Provider  ?Ascorbic Acid (VITAMIN C) 1000 MG tablet Take 1,000 mg by mouth daily.   Yes [provider]  ?b complex vitamins capsule Take 1 capsule by mouth daily.   Yes [provider]  ?celecoxib (CELEBREX) 100 MG capsule Take 100 mg by mouth 2 (two) times daily.   Yes [provider]  ?Cholecalciferol (VITAMIN D) 50 MCG (2000 UT) tablet Take 4,000 Units by mouth daily.   Yes [provider]  ?Coenzyme Q10 (COQ10) 100 MG CAPS Take 100 mg by mouth daily.   Yes [provider]  ?COLLAGEN PO Take by mouth.   Yes [provider]  ?denosumab (PROLIA) 60 MG/ML SOSY injection Inject 60 mg into the skin every 6 (six) months.   Yes [provider]  ?Lysine 500 MG CAPS Take 500 mg by mouth daily.   Yes [provider]  ?MAGNESIUM-POTASSIUM PO Take 2 tablets by mouth 2 (two) times daily.   Yes [provider]  ?Omega-3 Fatty Acids (FISH OIL) 1200 MG CAPS Take 2,400  mg by mouth 2 (two) times daily.   Yes [provider]  ?OVER THE COUNTER MEDICATION Take 1-2 tablets by mouth See admin instructions. Bone Up otc supplement Take 2 tablets in the morning and 1 tablet in the evening   Yes [provider]  ?OVER THE COUNTER MEDICATION Take 2 tablets by mouth daily. CholestMD   Yes [provider]  ?OVER THE COUNTER MEDICATION Apply 1 application topically daily as needed (pain). CBD cream   Yes [provider]  ?Probiotic Product (PROBIOTIC PO) Take 1 capsule by mouth daily.   Yes [provider]  ?SYNTHROID 150 MCG tablet Take 150 mcg by mouth daily. 07/24/14  Yes [provider]  ?TRYPTOPHAN PO Take 500 mg by mouth at bedtime.   Yes [provider]  ?zinc gluconate 50 MG  tablet Take 50 mg by mouth daily.   Yes [provider]  ?fluticasone (CUTIVATE) 0.05 % cream Apply 1 application topically daily as needed for rash. 02/26/20   [provider]  ? ? ?ROS: All other systems have been reviewed and were otherwise negative with the exception of those mentioned in the HPI and as above. ? ?Physical Exam: ?General: Alert, no acute distress ?Cardiovascular: No pedal edema ?Respiratory: No cyanosis, no use of accessory musculature ?GI: No organomegaly, abdomen is soft and non-tender ?Skin: No lesions in the area of chief complaint ?Neurologic: Sensation intact distally ?Psychiatric: Patient is competent for consent with normal mood and affect ?Lymphatic: No axillary or cervical lymphadenopathy ? ?MUSCULOSKELETAL:  ?On exam, active elevation to about 70 degrees; passive to about 80-90 degrees; external rotation to 10; internal rotation to back pocket.  Cuff strength is 5- throughout.   ? ?Imaging: ?X-rays demonstrate a B3 glenoid with end-stage glenohumeral arthritis. ? ?CT scan reviewed and 3D blueprint performed.  Patient has significant posterior erosion and would benefit from surgery. ? ?Body mass index is 24.54 kg/m?. ? ?No results found for: ALBUMIN ? ? ?Patient does not have a diagnosis of diabetes. ?  ? ?.  ?  ?  ? ? ? ? ? ? ? ? ? ? ?Assessment: ?djd right shoulder ? ?Plan: ?Plan for Procedure(s): ?REVERSE SHOULDER ARTHROPLASTY ? ?The risks benefits and alternatives were discussed with the patient including but not limited to the risks of nonoperative treatment, versus surgical intervention including infection, bleeding, nerve injury,  blood clots, cardiopulmonary complications, morbidity, mortality, among others, and they were willing to proceed.  ? ?We additionally specifically discussed risks of axillary nerve injury, infection, periprosthetic fracture, continued pain and longevity of implants prior to beginning procedure.  ?  ?Patient will be closely monitored in  PACU for medical stabilization and pain control. If found stable in PACU, patient may be discharged home with outpatient follow-up. If any concerns regarding patient's stabilization patient will be admitted for observation after surgery. The patient is planning to be discharged home with outpatient PT.  ? ?The patient acknowledged the explanation, agreed to proceed with the plan and consent was signed.  ? ?He received operative clearance from his PCP, Dr. Laurann Montana. ? ?Operative Plan: Right reverse total shoulder arthroplasty ?Discharge Medications: Standard ?DVT Prophylaxis: Aspirin ?Physical Therapy: Outpatient PT ?Special Discharge needs: Sling. IceMan ? ? ?Ethelda Chick, PA-C ? ?10/20/2021 ?4:44 PM ? ?

## 2021-10-21 ENCOUNTER — Ambulatory Visit (HOSPITAL_BASED_OUTPATIENT_CLINIC_OR_DEPARTMENT_OTHER): Payer: Medicare Other | Admitting: Anesthesiology

## 2021-10-21 ENCOUNTER — Ambulatory Visit (HOSPITAL_COMMUNITY): Payer: Medicare Other

## 2021-10-21 ENCOUNTER — Other Ambulatory Visit: Payer: Self-pay

## 2021-10-21 ENCOUNTER — Encounter (HOSPITAL_BASED_OUTPATIENT_CLINIC_OR_DEPARTMENT_OTHER)
Admission: RE | Disposition: A | Discharge status: Home / Self Care | Payer: Self-pay | Source: Home / Self Care | Attending: Orthopaedic Surgery

## 2021-10-21 ENCOUNTER — Encounter (HOSPITAL_BASED_OUTPATIENT_CLINIC_OR_DEPARTMENT_OTHER): Payer: Self-pay | Admitting: Orthopaedic Surgery

## 2021-10-21 ENCOUNTER — Ambulatory Visit (HOSPITAL_BASED_OUTPATIENT_CLINIC_OR_DEPARTMENT_OTHER)
Admission: RE | Admit: 2021-10-21 | Discharge: 2021-10-21 | Disposition: A | Discharge status: Home / Self Care | Payer: Medicare Other | Attending: Orthopaedic Surgery | Admitting: Orthopaedic Surgery

## 2021-10-21 DIAGNOSIS — M19011 Primary osteoarthritis, right shoulder: Secondary | ICD-10-CM

## 2021-10-21 DIAGNOSIS — E039 Hypothyroidism, unspecified: Secondary | ICD-10-CM | POA: Diagnosis not present

## 2021-10-21 DIAGNOSIS — Z96611 Presence of right artificial shoulder joint: Secondary | ICD-10-CM | POA: Diagnosis not present

## 2021-10-21 DIAGNOSIS — Z471 Aftercare following joint replacement surgery: Secondary | ICD-10-CM | POA: Diagnosis not present

## 2021-10-21 DIAGNOSIS — G8918 Other acute postprocedural pain: Secondary | ICD-10-CM | POA: Diagnosis not present

## 2021-10-21 DIAGNOSIS — Z09 Encounter for follow-up examination after completed treatment for conditions other than malignant neoplasm: Secondary | ICD-10-CM

## 2021-10-21 HISTORY — PX: REVERSE SHOULDER ARTHROPLASTY: SHX5054

## 2021-10-21 HISTORY — DX: Primary osteoarthritis, right shoulder: M19.011

## 2021-10-21 HISTORY — DX: Age-related osteoporosis without current pathological fracture: M81.0

## 2021-10-21 SURGERY — ARTHROPLASTY, SHOULDER, TOTAL, REVERSE
Anesthesia: Regional | Site: Shoulder | Laterality: Right

## 2021-10-21 MED ORDER — MIDAZOLAM HCL 2 MG/2ML IJ SOLN
2.0000 mg | Freq: Once | INTRAMUSCULAR | Status: AC
  Administered 2021-10-21: 1 mg via INTRAVENOUS

## 2021-10-21 MED ORDER — BUPIVACAINE HCL (PF) 0.5 % IJ SOLN
INTRAMUSCULAR | Status: DC | PRN
  Administered 2021-10-21: 10 mL
  Administered 2021-10-21: 15 mL

## 2021-10-21 MED ORDER — LIDOCAINE 2% (20 MG/ML) 5 ML SYRINGE
INTRAMUSCULAR | Status: AC
  Filled 2021-10-21: qty 5

## 2021-10-21 MED ORDER — LACTATED RINGERS IV SOLN: INTRAVENOUS | Status: DC

## 2021-10-21 MED ORDER — OXYCODONE HCL 5 MG/5ML PO SOLN: 5.0000 mg | Freq: Once | ORAL | Status: DC | PRN

## 2021-10-21 MED ORDER — VANCOMYCIN HCL 1000 MG IV SOLR
INTRAVENOUS | Status: AC
  Filled 2021-10-21: qty 40

## 2021-10-21 MED ORDER — ROCURONIUM BROMIDE 10 MG/ML (PF) SYRINGE
PREFILLED_SYRINGE | INTRAVENOUS | Status: AC
  Filled 2021-10-21: qty 10

## 2021-10-21 MED ORDER — GABAPENTIN 300 MG PO CAPS
300.0000 mg | ORAL_CAPSULE | Freq: Once | ORAL | Status: AC
  Administered 2021-10-21: 300 mg via ORAL

## 2021-10-21 MED ORDER — ASPIRIN 81 MG PO CHEW: 81.0000 mg | CHEWABLE_TABLET | Freq: Two times a day (BID) | ORAL | 0 refills | Status: AC

## 2021-10-21 MED ORDER — DEXAMETHASONE SODIUM PHOSPHATE 10 MG/ML IJ SOLN
INTRAMUSCULAR | Status: AC
  Filled 2021-10-21: qty 1

## 2021-10-21 MED ORDER — CEFAZOLIN SODIUM-DEXTROSE 2-4 GM/100ML-% IV SOLN
2.0000 g | INTRAVENOUS | Status: AC
  Administered 2021-10-21: 2 g via INTRAVENOUS

## 2021-10-21 MED ORDER — CELECOXIB 100 MG PO CAPS: 100.0000 mg | ORAL_CAPSULE | Freq: Two times a day (BID) | ORAL | 0 refills | Status: AC

## 2021-10-21 MED ORDER — ONDANSETRON HCL 4 MG/2ML IJ SOLN
INTRAMUSCULAR | Status: DC | PRN
  Administered 2021-10-21: 4 mg via INTRAVENOUS

## 2021-10-21 MED ORDER — FENTANYL CITRATE (PF) 100 MCG/2ML IJ SOLN
INTRAMUSCULAR | Status: AC
  Filled 2021-10-21: qty 2

## 2021-10-21 MED ORDER — SUGAMMADEX SODIUM 200 MG/2ML IV SOLN
INTRAVENOUS | Status: DC | PRN
  Administered 2021-10-21: 200 mg via INTRAVENOUS

## 2021-10-21 MED ORDER — FENTANYL CITRATE (PF) 100 MCG/2ML IJ SOLN
100.0000 ug | Freq: Once | INTRAMUSCULAR | Status: AC
  Administered 2021-10-21: 50 ug via INTRAVENOUS

## 2021-10-21 MED ORDER — AMISULPRIDE (ANTIEMETIC) 5 MG/2ML IV SOLN: 10.0000 mg | Freq: Once | INTRAVENOUS | Status: DC | PRN

## 2021-10-21 MED ORDER — TRANEXAMIC ACID-NACL 1000-0.7 MG/100ML-% IV SOLN
1000.0000 mg | INTRAVENOUS | Status: AC
  Administered 2021-10-21: 1000 mg via INTRAVENOUS

## 2021-10-21 MED ORDER — PROPOFOL 10 MG/ML IV BOLUS
INTRAVENOUS | Status: DC | PRN
  Administered 2021-10-21: 130 mg via INTRAVENOUS

## 2021-10-21 MED ORDER — TRANEXAMIC ACID-NACL 1000-0.7 MG/100ML-% IV SOLN
INTRAVENOUS | Status: AC
  Filled 2021-10-21: qty 100

## 2021-10-21 MED ORDER — DEXAMETHASONE SODIUM PHOSPHATE 4 MG/ML IJ SOLN
INTRAMUSCULAR | Status: DC | PRN
  Administered 2021-10-21: 5 mg via INTRAVENOUS

## 2021-10-21 MED ORDER — HYDROMORPHONE HCL 1 MG/ML IJ SOLN
INTRAMUSCULAR | Status: AC
  Filled 2021-10-21: qty 0.5

## 2021-10-21 MED ORDER — ONDANSETRON HCL 4 MG/2ML IJ SOLN: 4.0000 mg | Freq: Once | INTRAMUSCULAR | Status: DC | PRN

## 2021-10-21 MED ORDER — OXYCODONE HCL 5 MG PO TABS: 5.0000 mg | ORAL_TABLET | Freq: Once | ORAL | Status: DC | PRN

## 2021-10-21 MED ORDER — GABAPENTIN 300 MG PO CAPS
ORAL_CAPSULE | ORAL | Status: AC
  Filled 2021-10-21: qty 1

## 2021-10-21 MED ORDER — OXYCODONE HCL 5 MG PO TABS
ORAL_TABLET | ORAL | 0 refills | Status: AC
Start: 2021-10-21 — End: 2021-10-26

## 2021-10-21 MED ORDER — ONDANSETRON HCL 4 MG/2ML IJ SOLN
INTRAMUSCULAR | Status: AC
  Filled 2021-10-21: qty 2

## 2021-10-21 MED ORDER — VANCOMYCIN HCL 1000 MG IV SOLR
INTRAVENOUS | Status: DC | PRN
  Administered 2021-10-21: 1000 mg

## 2021-10-21 MED ORDER — HYDROMORPHONE HCL 1 MG/ML IJ SOLN
0.2500 mg | INTRAMUSCULAR | Status: DC | PRN
  Administered 2021-10-21: 0.5 mg via INTRAVENOUS

## 2021-10-21 MED ORDER — ACETAMINOPHEN 500 MG PO TABS
ORAL_TABLET | ORAL | Status: AC
  Filled 2021-10-21: qty 2

## 2021-10-21 MED ORDER — BUPIVACAINE LIPOSOME 1.3 % IJ SUSP
INTRAMUSCULAR | Status: DC | PRN
  Administered 2021-10-21: 3 mL
  Administered 2021-10-21: 10 mL

## 2021-10-21 MED ORDER — PROPOFOL 500 MG/50ML IV EMUL
INTRAVENOUS | Status: AC
  Filled 2021-10-21: qty 50

## 2021-10-21 MED ORDER — FENTANYL CITRATE (PF) 100 MCG/2ML IJ SOLN: INTRAMUSCULAR | Status: DC | PRN

## 2021-10-21 MED ORDER — LIDOCAINE HCL (CARDIAC) PF 100 MG/5ML IV SOSY
PREFILLED_SYRINGE | INTRAVENOUS | Status: DC | PRN
Start: 2021-10-21 — End: 2021-10-21
  Administered 2021-10-21: 40 mg via INTRAVENOUS

## 2021-10-21 MED ORDER — ACETAMINOPHEN 500 MG PO TABS: 1000.0000 mg | ORAL_TABLET | Freq: Three times a day (TID) | ORAL | 0 refills | Status: AC

## 2021-10-21 MED ORDER — ROCURONIUM BROMIDE 100 MG/10ML IV SOLN
INTRAVENOUS | Status: DC | PRN
Start: 2021-10-21 — End: 2021-10-21
  Administered 2021-10-21: 70 mg via INTRAVENOUS
  Administered 2021-10-21: 10 mg via INTRAVENOUS

## 2021-10-21 MED ORDER — PHENYLEPHRINE HCL-NACL 20-0.9 MG/250ML-% IV SOLN
INTRAVENOUS | Status: DC | PRN
  Administered 2021-10-21: 40 ug/min via INTRAVENOUS

## 2021-10-21 MED ORDER — FENTANYL CITRATE (PF) 100 MCG/2ML IJ SOLN
INTRAMUSCULAR | Status: AC
Start: 2021-10-21 — End: ?
  Filled 2021-10-21: qty 2

## 2021-10-21 MED ORDER — CEFAZOLIN SODIUM-DEXTROSE 2-4 GM/100ML-% IV SOLN
INTRAVENOUS | Status: AC
  Filled 2021-10-21: qty 100

## 2021-10-21 MED ORDER — 0.9 % SODIUM CHLORIDE (POUR BTL) OPTIME
TOPICAL | Status: DC | PRN
  Administered 2021-10-21: 1000 mL

## 2021-10-21 MED ORDER — OMEPRAZOLE 20 MG PO CPDR: 20.0000 mg | DELAYED_RELEASE_CAPSULE | Freq: Every day | ORAL | 0 refills | Status: AC

## 2021-10-21 MED ORDER — EPHEDRINE SULFATE (PRESSORS) 50 MG/ML IJ SOLN
INTRAMUSCULAR | Status: DC | PRN
Start: 2021-10-21 — End: 2021-10-21
  Administered 2021-10-21: 10 mg via INTRAVENOUS

## 2021-10-21 MED ORDER — EPINEPHRINE PF 1 MG/ML IJ SOLN
INTRAMUSCULAR | Status: AC
  Filled 2021-10-21: qty 4

## 2021-10-21 MED ORDER — MIDAZOLAM HCL 2 MG/2ML IJ SOLN
INTRAMUSCULAR | Status: AC
  Filled 2021-10-21: qty 2

## 2021-10-21 MED ORDER — ONDANSETRON HCL 4 MG PO TABS: 4.0000 mg | ORAL_TABLET | Freq: Three times a day (TID) | ORAL | 0 refills | Status: AC | PRN

## 2021-10-21 MED ORDER — ACETAMINOPHEN 500 MG PO TABS
1000.0000 mg | ORAL_TABLET | Freq: Once | ORAL | Status: AC
  Administered 2021-10-21: 1000 mg via ORAL

## 2021-10-21 SURGICAL SUPPLY — 71 items
AID PSTN UNV HD RSTRNT DISP (MISCELLANEOUS) ×1
APL PRP STRL LF DISP 70% ISPRP (MISCELLANEOUS) ×1
BASEPLATE GLENOID STD REV 42 (Joint) ×1 IMPLANT
BASEPLATE HALF WEDGE 35X29 (Plate) ×1 IMPLANT
BIT DRILL 3.2 PERIPHERAL SCREW (BIT) ×1 IMPLANT
BLADE HEX COATED 2.75 (ELECTRODE) IMPLANT
BLADE SAW SAG 73X25 THK (BLADE) ×1
BLADE SAW SGTL 73X25 THK (BLADE) ×1 IMPLANT
BLADE SURG 10 STRL SS (BLADE) IMPLANT
BLADE SURG 15 STRL LF DISP TIS (BLADE) IMPLANT
BLADE SURG 15 STRL SS (BLADE)
BNDG COHESIVE 4X5 TAN ST LF (GAUZE/BANDAGES/DRESSINGS) IMPLANT
BRUSH SCRUB EZ PLAIN DRY (MISCELLANEOUS) ×2 IMPLANT
BSPLAT GLND 35D 29 HLF WDG (Plate) ×1 IMPLANT
CHLORAPREP W/TINT 26 (MISCELLANEOUS) ×2 IMPLANT
COOLER ICEMAN CLASSIC (MISCELLANEOUS) ×2 IMPLANT
COVER BACK TABLE 60X90IN (DRAPES) ×2 IMPLANT
COVER MAYO STAND STRL (DRAPES) ×2 IMPLANT
DRAPE IMP U-DRAPE 54X76 (DRAPES) ×2 IMPLANT
DRAPE INCISE IOBAN 66X45 STRL (DRAPES) ×2 IMPLANT
DRAPE U-SHAPE 76X120 STRL (DRAPES) ×4 IMPLANT
DRSG AQUACEL AG ADV 3.5X 6 (GAUZE/BANDAGES/DRESSINGS) ×2 IMPLANT
ELECT BLADE 4.0 EZ CLEAN MEGAD (MISCELLANEOUS) ×2
ELECT REM PT RETURN 9FT ADLT (ELECTROSURGICAL) ×2
ELECTRODE BLDE 4.0 EZ CLN MEGD (MISCELLANEOUS) ×1 IMPLANT
ELECTRODE REM PT RTRN 9FT ADLT (ELECTROSURGICAL) ×1 IMPLANT
FACESHIELD WRAPAROUND (MASK) ×4 IMPLANT
FACESHIELD WRAPAROUND OR TEAM (MASK) ×2 IMPLANT
GLOVE SRG 8 PF TXTR STRL LF DI (GLOVE) ×1 IMPLANT
GLOVE SURG ENC MOIS LTX SZ6.5 (GLOVE) ×4 IMPLANT
GLOVE SURG LTX SZ8 (GLOVE) ×4 IMPLANT
GLOVE SURG UNDER POLY LF SZ6.5 (GLOVE) ×4 IMPLANT
GLOVE SURG UNDER POLY LF SZ8 (GLOVE) ×2
GOWN STRL REUS W/ TWL LRG LVL3 (GOWN DISPOSABLE) ×2 IMPLANT
GOWN STRL REUS W/TWL LRG LVL3 (GOWN DISPOSABLE) ×6
GOWN STRL REUS W/TWL XL LVL3 (GOWN DISPOSABLE) ×2 IMPLANT
GUIDE PIN 3X75 SHOULDER (PIN) ×2
GUIDEWIRE GLENOID 2.5X220 (WIRE) ×1 IMPLANT
HANDPIECE INTERPULSE COAX TIP (DISPOSABLE) ×2
INSERT HUMERAL +6 SZ3/4 .42 (Joint) ×1 IMPLANT
KIT SHOULDER STAB MARCO (KITS) ×2 IMPLANT
MANIFOLD NEPTUNE II (INSTRUMENTS) ×2 IMPLANT
PACK BASIN DAY SURGERY FS (CUSTOM PROCEDURE TRAY) ×2 IMPLANT
PACK SHOULDER (CUSTOM PROCEDURE TRAY) ×2 IMPLANT
PAD COLD SHLDR WRAP-ON (PAD) ×2 IMPLANT
PAD ORTHO SHOULDER 7X19 LRG (SOFTGOODS) ×2 IMPLANT
PENCIL SMOKE EVACUATOR (MISCELLANEOUS) IMPLANT
PIN GUIDE 3X75 SHOULDER (PIN) IMPLANT
RESTRAINT HEAD UNIVERSAL NS (MISCELLANEOUS) ×2 IMPLANT
SCREW 5.0X18 (Screw) ×1 IMPLANT
SCREW 5.5X22 (Screw) ×1 IMPLANT
SCREW BONE 6.5X40 SM (Screw) ×1 IMPLANT
SCREW PERIPHERAL 30 (Screw) ×2 IMPLANT
SET HNDPC FAN SPRY TIP SCT (DISPOSABLE) ×1 IMPLANT
SHEET MEDIUM DRAPE 40X70 STRL (DRAPES) ×2 IMPLANT
SLEEVE SCD COMPRESS KNEE MED (STOCKING) ×2 IMPLANT
SPIKE FLUID TRANSFER (MISCELLANEOUS) IMPLANT
SPONGE T-LAP 18X18 ~~LOC~~+RFID (SPONGE) ×2 IMPLANT
STEM HUMERAL STD SHORT SZ3 (Joint) ×1 IMPLANT
STRIP CLOSURE SKIN 1/2X4 (GAUZE/BANDAGES/DRESSINGS) ×2 IMPLANT
SUT ETHIBOND 2 V 37 (SUTURE) ×2 IMPLANT
SUT ETHIBOND NAB CT1 #1 30IN (SUTURE) ×2 IMPLANT
SUT FIBERWIRE #5 38 CONV NDL (SUTURE) ×8
SUT MNCRL AB 4-0 PS2 18 (SUTURE) ×2 IMPLANT
SUT VIC AB 0 CT1 27 (SUTURE)
SUT VIC AB 0 CT1 27XCR 8 STRN (SUTURE) IMPLANT
SUT VIC AB 3-0 SH 27 (SUTURE) ×2
SUT VIC AB 3-0 SH 27X BRD (SUTURE) ×1 IMPLANT
SUTURE FIBERWR #5 38 CONV NDL (SUTURE) ×4 IMPLANT
TOWEL GREEN STERILE FF (TOWEL DISPOSABLE) ×6 IMPLANT
TUBE SUCTION HIGH CAP CLEAR NV (SUCTIONS) ×2 IMPLANT

## 2021-10-21 NOTE — Anesthesia Procedure Notes (Signed)
Anesthesia Regional Block: Interscalene brachial plexus block  ? ?Pre-Anesthetic Checklist: , timeout performed,  Correct Patient, Correct Site, Correct Laterality,  Correct Procedure, Correct Position, site marked,  Risks and benefits discussed,  Surgical consent,  Pre-op evaluation,  At surgeon's request and post-op pain management ? ?Laterality: Right ? ?Prep: Maximum Sterile Barrier Precautions used, chloraprep     ?  ?Needles:  ?Injection technique: Single-shot ? ?Needle Type: Echogenic Stimulator Needle   ? ? ?Needle Length: 9cm  ?Needle Gauge: 22  ? ? ? ?Additional Needles: ? ? ?Procedures:,,,, ultrasound used (permanent image in chart),,    ?Narrative:  ?Start time: 10/21/2021 7:40 AM ?End time: 10/21/2021 7:45 AM ?Injection made incrementally with aspirations every 5 mL. ? ?Performed by: Personally  ?Anesthesiologist: Pervis Hocking, DO ? ?Additional Notes: ?Monitors applied. No increased pain on injection. No increased resistance to injection. Injection made in 5cc increments. Good needle visualization. Patient tolerated procedure well.  ? ? ? ? ?

## 2021-10-21 NOTE — Transfer of Care (Signed)
Immediate Anesthesia Transfer of Care Note ? ?Patient: Greg Santiago ? ?Procedure(s) Performed: REVERSE SHOULDER ARTHROPLASTY (Right: Shoulder) ? ?Patient Location: PACU ? ?Anesthesia Type:GA combined with regional for post-op pain ? ?Level of Consciousness: awake, alert  and oriented ? ?Airway & Oxygen Therapy: Patient Spontanous Breathing and Patient connected to face mask oxygen ? ?Post-op Assessment: Report given to RN and Post -op Vital signs reviewed and stable ? ?Post vital signs: Reviewed and stable ? ?Last Vitals:  ?Vitals Value Taken Time  ?BP 134/85 10/21/21 0945  ?Temp    ?Pulse 69 10/21/21 0945  ?Resp 12 10/21/21 0945  ?SpO2 100 % 10/21/21 0945  ?Vitals shown include unvalidated device data. ? ?Last Pain:  ?Vitals:  ? 10/21/21 0632  ?TempSrc: Oral  ?PainSc: 4   ?   ? ?Patients Stated Pain Goal: 4 (10/21/21 9371) ? ?Complications: No notable events documented. ?

## 2021-10-21 NOTE — Progress Notes (Signed)
Assisted Dr. Doroteo Glassman with right, ultrasound guided, supraclavicular block in PACU. Side rails up, monitors on throughout procedure. See vital signs in flow sheet. Tolerated Procedure well. ?

## 2021-10-21 NOTE — Discharge Instructions (Addendum)
Ophelia Charter MD, MPH Noemi Chapel, PA-C [place] [address], [address] [phone] (tel)   [phone] (fax)   [place] may leave the operative dressing in place until your follow-up appointment. KEEP THE INCISIONS CLEAN AND DRY. There may be a small amount of fluid/bleeding leaking at the surgical site. This is normal after surgery.  If it fills with liquid or blood please call us immediately to change it for you. Use the provided ice machine or Ice packs as often as possible for the first 3-4 days, then as needed for pain relief.   Keep a layer of cloth or a shirt between your skin and the cooling unit to prevent frost bite as it can get very cold.  SHOWERING: - You may shower on Post-Op Day #2.  - The dressing is water resistant but do not scrub it as it may start to peel up.   - You may remove the sling for showering - Gently pat the area dry.  - Do not soak the shoulder in water. Do not go swimming in the pool or ocean until your incision has completely healed (about 4-6 weeks post-operatively) - KEEP THE INCISIONS CLEAN AND DRY.  EXERCISES Wear the sling at all times You may remove the sling for showering, but keep the arm across the chest or in a secondary sling.    Accidental/Purposeful External Rotation and shoulder flexion (reaching behind you) is to be avoided at all costs for the first month. It is ok to come out of your sling if your are sitting and have assistance for eating.   Do not lift anything heavier than 1 pound until we discuss it further in clinic.  REGIONAL ANESTHESIA (NERVE BLOCKS) The anesthesia team may have performed a nerve block for you if safe in the setting of your care.  This is a great tool used to minimize pain.  Typically the block may start wearing off overnight but the long acting medicine may last for 3-4 days.  The nerve block wearing off can be a  challenging period but please utilize your as needed pain medications to try and manage this period.    POST-OP MEDICATIONS- Multimodal approach to pain control In general your pain will be controlled with a combination of substances.  Prescriptions unless otherwise discussed are electronically sent to your pharmacy.  This is a carefully made plan we use to minimize narcotic use.     Celebrex - Anti-inflammatory medication taken on a scheduled basis Acetaminophen - Non-narcotic pain medicine taken on a scheduled basis  Oxycodone - This is a strong narcotic, to be used only on an as needed basis for SEVERE pain. Aspirin 81mg  - This medicine is used to minimize the risk of blood clots after surgery. Omeprazole - daily medicine to protect your stomach while taking anti-inflammatories.   Zofran -  take as needed for nausea  NEXT DOSE OF TYLENOL AFTER 2:40 PM AS NEEDED FOR PAIN.   FOLLOW-UP If you develop a Fever (>101.5), Redness or Drainage from the surgical incision site, please call our office to arrange for an evaluation. Please call the office to schedule a follow-up appointment for a wound check, 7-10 days post-operatively.  IF YOU HAVE ANY QUESTIONS, PLEASE FEEL FREE TO CALL OUR OFFICE.  HELPFUL INFORMATION  If you had a block, it will wear off between 8-24 hrs postop typically.  This is period when your pain  may go from nearly zero to the pain you would have had post-op without the block.  This is an abrupt transition but nothing dangerous is happening.  You may take an extra dose of narcotic when this happens.  Your arm will be in a sling following surgery. You will be in this sling for the next 4 weeks.    You may be more comfortable sleeping in a semi-seated position the first few nights following surgery.  Keep a pillow propped under the elbow and forearm for comfort.  If you have a recliner type of chair it might be beneficial.  If not that is fine too, but it would be helpful to  sleep propped up with pillows behind your operated shoulder as well under your elbow and forearm.  This will reduce pulling on the suture lines.  When dressing, put your operative arm in the sleeve first.  When getting undressed, take your operative arm out last.  Loose fitting, button-down shirts are recommended.  In most states it is against the law to drive while your arm is in a sling. And certainly against the law to drive while taking narcotics.  You may return to work/school in the next couple of days when you feel up to it. Desk work and typing in the sling is fine.  We suggest you use the pain medication the first night prior to going to bed, in order to ease any pain when the anesthesia wears off. You should avoid taking pain medications on an empty stomach as it will make you nauseous.  Do not drink alcoholic beverages or take illicit drugs when taking pain medications.  Pain medication may make you constipated.  Below are a few solutions to try in this order: Decrease the amount of pain medication if you arent having pain. Drink lots of decaffeinated fluids. Drink prune juice and/or each dried prunes  If the first 3 dont work start with additional solutions Take Colace - an over-the-counter stool softener Take Senokot - an over-the-counter laxative Take Miralax - a stronger over-the-counter laxative   Dental Antibiotics:  In most cases prophylactic antibiotics for Dental procdeures after total joint surgery are not necessary.  Exceptions are as follows:  1. History of prior total joint infection  2. Severely immunocompromised (Organ Transplant, cancer chemotherapy, Rheumatoid biologic meds such as Sims)  3. Poorly controlled diabetes (A1C &gt; 8.0, blood glucose over 200)  If you have one of these conditions, contact your surgeon for an antibiotic prescription, prior to your dental procedure.   For more information including helpful videos and documents visit  our website:   [email or web address]    Post Anesthesia Home Care Instructions  Activity: Get plenty of rest for the remainder of the day. A responsible individual must stay with you for 24 hours following the procedure.  For the next 24 hours, DO NOT: -Drive a car -Paediatric nurse -Drink alcoholic beverages -Take any medication unless instructed by your physician -Make any legal decisions or sign important papers.  Meals: Start with liquid foods such as gelatin or soup. Progress to regular foods as tolerated. Avoid greasy, spicy, heavy foods. If nausea and/or vomiting occur, drink only clear liquids until the nausea and/or vomiting subsides. Call your physician if vomiting continues.  Special Instructions/Symptoms: Your throat may feel dry or sore from the anesthesia or the breathing tube placed in your throat during surgery. If this causes discomfort, gargle with warm salt water. The discomfort should disappear within 24 hours.  If  you had a scopolamine patch placed behind your ear for the management of post- operative nausea and/or vomiting:  1. The medication in the patch is effective for 72 hours, after which it should be removed.  Wrap patch in a tissue and discard in the trash. Wash hands thoroughly with soap and water. 2. You may remove the patch earlier than 72 hours if you experience unpleasant side effects which may include dry mouth, dizziness or visual disturbances. 3. Avoid touching the patch. Wash your hands with soap and water after contact with the patch.      Regional Anesthesia Blocks  1. Numbness or the inability to move the "blocked" extremity may last from 3-48 hours after placement. The length of time depends on the medication injected and your individual response to the medication. If the numbness is not going away after 48 hours, call your surgeon.  2. The extremity that is blocked will need to be protected until the numbness  is gone and the  Strength has returned. Because you cannot feel it, you will need to take extra care to avoid injury. Because it may be weak, you may have difficulty moving it or using it. You may not know what position it is in without looking at it while the block is in effect.  3. For blocks in the legs and feet, returning to weight bearing and walking needs to be done carefully. You will need to wait until the numbness is entirely gone and the strength has returned. You should be able to move your leg and foot normally before you try and bear weight or walk. You will need someone to be with you when you first try to ensure you do not fall and possibly risk injury.  Information for Discharge Teaching: EXPAREL (bupivacaine liposome injectable suspension)   Your surgeon or anesthesiologist gave you EXPAREL(bupivacaine) to help control your pain after surgery.  EXPAREL is a local anesthetic that provides pain relief by numbing the tissue around the surgical site. EXPAREL is designed to release pain medication over time and can control pain for up to 72 hours. Depending on how you respond to EXPAREL, you may require less pain medication during your recovery.  Possible side effects: Temporary loss of sensation or ability to move in the area where bupivacaine was injected. Nausea, vomiting, constipation Rarely, numbness and tingling in your mouth or lips, lightheadedness, or anxiety may occur. Call your doctor right away if you think you may be experiencing any of these sensations, or if you have other questions regarding possible side effects.  Follow all other discharge instructions given to you by your surgeon or nurse. Eat a healthy diet and drink plenty of water or other fluids.  If you return to the hospital for any reason within 96 hours following the administration of EXPAREL, it is important for health care providers to know that you have received this anesthetic. A teal colored band has  been placed on your arm with the date, time and amount of EXPAREL you have received in order to alert and inform your health care providers. Please leave this armband in place for the full 96 hours following administration, and then you may remove the band.   4. Bruising and tenderness at the needle site are common side effects and will resolve in a few days.  5. Persistent numbness or new problems with movement should be communicated to the surgeon or the [place] [phone] [place] [phone]).

## 2021-10-21 NOTE — Anesthesia Procedure Notes (Signed)
Procedure Name: Intubation ?Date/Time: 10/21/2021 7:58 AM ?Performed by: Maryella Shivers, CRNA ?Pre-anesthesia Checklist: Patient identified, Emergency Drugs available, Suction available and Patient being monitored ?Patient Re-evaluated:Patient Re-evaluated prior to induction ?Oxygen Delivery Method: Circle system utilized ?Preoxygenation: Pre-oxygenation with 100% oxygen ?Induction Type: IV induction ?Ventilation: Mask ventilation without difficulty ?Laryngoscope Size: Mac and 4 ?Grade View: Grade I ?Tube type: Oral ?Tube size: 7.5 mm ?Number of attempts: 1 ?Airway Equipment and Method: Stylet and Oral airway ?Placement Confirmation: ETT inserted through vocal cords under direct vision, positive ETCO2 and breath sounds checked- equal and bilateral ?Secured at: 21 cm ?Tube secured with: Tape ?Dental Injury: Teeth and Oropharynx as per pre-operative assessment  ? ? ? ? ?

## 2021-10-21 NOTE — Progress Notes (Signed)
Assisted Dr. Doroteo Glassman with right, ultrasound guided, interscalene  block. Side rails up, monitors on throughout procedure. See vital signs in flow sheet. Tolerated Procedure well. ?

## 2021-10-21 NOTE — Interval H&P Note (Signed)
All questions answered, patient wants to proceed with procedure. ? ?

## 2021-10-21 NOTE — Anesthesia Procedure Notes (Signed)
Anesthesia Regional Block: Supraclavicular block  ? ?Pre-Anesthetic Checklist: , timeout performed,  Correct Patient, Correct Site, Correct Laterality,  Correct Procedure, Correct Position, site marked,  Risks and benefits discussed,  Surgical consent,  Pre-op evaluation,  At surgeon's request and post-op pain management ? ?Laterality: Right ? ?Prep: Maximum Sterile Barrier Precautions used, chloraprep     ?  ?Needles:  ?Injection technique: Single-shot ? ?Needle Type: Echogenic Stimulator Needle   ? ? ?Needle Length: 9cm  ?Needle Gauge: 22  ? ? ? ?Additional Needles: ? ? ?Procedures:,,,, ultrasound used (permanent image in chart),,    ?Narrative:  ?Start time: 10/21/2021 10:20 AM ?End time: 10/21/2021 10:25 AM ?Injection made incrementally with aspirations every 5 mL. ? ?Performed by: Personally  ?Anesthesiologist: Pervis Hocking, DO ? ?Additional Notes: ?Monitors applied. No increased pain on injection. No increased resistance to injection. Injection made in 5cc increments. Good needle visualization. Patient tolerated procedure well.  ? ? ? ? ?

## 2021-10-21 NOTE — Op Note (Signed)
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  Patient's glenoid defect was obvious and his blueprint protocol templating helped [place] fit a half wedge implant.  We had good fixation.  Tug test was normal.  Significant amount of osteophyte was resected which helped with mobility and aided in dislocation.  Post-operative plan: The patient will be NWB in sling.  The patient will be will be discharged from PACU if continues to be stable as was plan prior to surgery.  DVT prophylaxis Aspirin 81 mg twice daily for 6 weeks.  Pain control with PRN pain medication preferring oral medicines.  Follow up plan will be scheduled in approximately 7 days for incision check and XR.  Physical therapy to start within the week.  Implants: Tornier perform humeral 3 stem, +6 polyethylene, 42 glenosphere with a half wedge 29 baseplate, 40 center screw, 4 peripheral screws.  Post-Op Diagnosis: Same Surgeons:Primary: Hiram Gash, MD Assistants:Caroline McBane PA-C Location: [facility] OR ROOM 6 Anesthesia: General with Exparel Interscalene Antibiotics: Ancef 2g preop, Vancomycin 1000mg  locally Tourniquet time: None Estimated Blood Loss: 449 Complications: None Specimens: None Implants: Implant Name Type Inv. Item Serial No. Manufacturer Lot No. LRB No. Used Action  aequalis perform reversed half wedge baseplate Orthopedic Implant  [id number]   Right 1 Implanted  BASEPLATE GLENOID STD REV 42 - [id number] Joint BASEPLATE GLENOID STD REV 42 [id number] [employer]  Right 1 Implanted  SCREW BONE 6.5X40 SM - [id number] Screw SCREW BONE 6.5X40 SM  [employer]  Right 1 Implanted  SCREW PERIPHERAL 30 - [id number] Screw SCREW PERIPHERAL 30   [employer]  Right 2 Implanted  SCREW 5.5X22 - [id number] Screw SCREW 5.5X22  [employer]  Right 1 Implanted  SCREW 5.0X18 - [id number] Screw SCREW 5.0X18  [employer]  Right 1 Implanted  STEM HUMERAL STD SHORT SZ3 - [id number] Joint STEM HUMERAL STD SHORT SZ3  [employer]  Right 1 Implanted  TOURNIER PERFORM HUMERAL SYSTEM SIZE 3   [id number]   Right 1 Implanted    Indications for Surgery:   [name] is a 76 y.o. male with end-stage glenohumeral arthritis and significant limitations in motion and function.  Benefits and risks of operative and nonoperative management were discussed prior to surgery with patient/guardian(s) and informed consent form was completed.  Infection and need for further surgery were discussed as was prosthetic stability and cuff issues.  We additionally specifically discussed risks of axillary nerve injury, infection, periprosthetic fracture, continued pain and longevity of implants prior to beginning procedure.      Procedure:   The patient was identified in the preoperative holding area where the surgical site was marked. Block placed by anesthesia with exparel.  The patient was taken to the OR where a procedural timeout was called and the above noted anesthesia was induced.  The patient was positioned beachchair on allen table with spider arm positioner.  Preoperative antibiotics were dosed.  The patient's right shoulder was prepped and draped in the usual sterile fashion.  A second preoperative timeout was called.       Standard deltopectoral approach was performed with a #10 blade. We dissected down to the subcutaneous tissues and the cephalic vein was taken laterally with the deltoid. Clavipectoral fascia was incised in  line with the incision. Deep retractors were placed. The long of the biceps tendon was identified and there was significant tenosynovitis present.  Tenodesis was performed to the pectoralis tendon with #2 Ethibond. The remaining biceps was  followed up into the rotator interval where it was released.   The subscapularis was taken down in a full thickness layer with capsule along the humeral neck extending inferiorly around the humeral head. We continued releasing the capsule directly off of the osteophytes inferiorly all the way around the corner. This allowed [place] to dislocate the humeral head.   The humeral head had evidence of severe osteoarthritic wear with full-thickness cartilage loss and exposed subchondral bone. There was significant flattening of the humeral head.   The rotator cuff was carefully examined and noted to be irreperably torn.  The decision was confirmed that a reverse total shoulder was indicated for this patient.  There were osteophytes along the inferior humeral neck. The osteophytes were removed with an osteotome and a rongeur.  Osteophytes were removed with a rongeur and an osteotome and the anatomic neck was well visualized.     A humeral cutting guide was used extra medullary with a pin to help control version. The version was set at 20 of retroversion. Humeral osteotomy was performed with an oscillating saw. The head fragment was passed off the back table.  A cut protector plate was placed.  The subscapularis was again identified and immediately we took care to palpate the axillary nerve anteriorly and verify its position with gentle palpation as well as the tug test.  We then released the SGHL with bovie cautery prior to placing a curved mayo at the junction of the anterior glenoid well above the axillary nerve and bluntly dissecting the subscapularis from the capsule.  We then carefully protected the axillary nerve as we gently released the inferior capsule to fully mobilize the subscapularis.  An anterior deltoid retractor was then placed as well as a small Hohmann retractor superiorly.   The glenoid was inspected and had evidence of severe osteoarthritic wear with full-thickness cartilage loss and exposed  subchondral bone.   The remaining labrum was removed circumferentially taking great care not to disrupt the posterior capsule.   Based on her blueprint planning we felt that the patient would require an augment to better correct his retroversion.  We used our plan intraoperatively and used a half wedge guide.  We placed our pin in the appropriate position.  We then reamed the native glenoid and then used the offset reamer to ream the Neo glenoid.  Once we had good fit we checked our position before using a 29 and half wedge implant with a 40 by 6.5 center screw.  The base plate was screwed into the glenoid vault obtaining secure fixation.  We placed 4 peripheral locking screws for fixation.  Next a 42 mm glenosphere was selected and impacted onto the baseplate. The center screw was tightened.  We turned attention back to the humeral side. The cut protector was removed.  We used the perform humeral sizing block to select the appropriate size which for this patient was a 3.  We then placed our center pin and reamed over it concentrically obtaining appropriate inset.  We then used our lateralizing chisel to prepare the lateral aspect of the humerus.  At that point we selected the appropriate implant trialing a 3.  Using this trial implant we trialed multiple polyethylene sizes settling on a 6 which provided good stability and  range of motion without excess soft tissue tension. The offset was dialed in to match the normal anatomy. The shoulder was trialed.  There was good ROM in all planes and the shoulder was stable with no inferior translation.  The real humeral implants were opened after again confirming sizes.  The trial was removed. #5 Fiberwire x4 sutures passed through the humeral neck for subscap repair. The humeral component was press-fit obtaining a secure fit. The joint was reduced and thoroughly irrigated with pulsatile lavage. Subscap was repaired back with #5 Fiberwire sutures through bone tunnels.  Hemostasis was obtained. The deltopectoral interval was reapproximated with #1 Ethibond. The subcutaneous tissues were closed with 2-0 Vicryl and the skin was closed with running monocryl.    The wounds were cleaned and dried and an Aquacel dressing was placed. The drapes taken down. The arm was placed into sling with abduction pillow. Patient was awakened, extubated, and transferred to the recovery room in stable condition. There were no intraoperative complications. The sponge, needle, and attention counts were  correct at the end of the case.     Noemi Chapel, PA-C, present and scrubbed throughout the case, critical for completion in a timely fashion, and for retraction, instrumentation, closure.

## 2021-10-21 NOTE — Anesthesia Postprocedure Evaluation (Signed)
Anesthesia Post Note ? ?Patient: Greg Santiago ? ?Procedure(s) Performed: REVERSE SHOULDER ARTHROPLASTY (Right: Shoulder) ? ?  ? ?Patient location during evaluation: PACU ?Anesthesia Type: Regional and General ?Level of consciousness: awake and alert, oriented and patient cooperative ?Pain management: pain level controlled ?Vital Signs Assessment: post-procedure vital signs reviewed and stable ?Respiratory status: spontaneous breathing, nonlabored ventilation and respiratory function stable ?Cardiovascular status: blood pressure returned to baseline and stable ?Postop Assessment: no apparent nausea or vomiting ?Anesthetic complications: no ?Comments: Rescue supraclav block in PACU for pain in forearm, good relief.  ? ? ?No notable events documented. ? ?Last Vitals:  ?Vitals:  ? 10/21/21 1030 10/21/21 1045  ?BP: 118/70 116/76  ?Pulse: 71 74  ?Resp: 13 11  ?Temp:    ?SpO2: 97% 97%  ?  ?Last Pain:  ?Vitals:  ? 10/21/21 1045  ?TempSrc:   ?PainSc: 5   ? ? ?  ?  ?  ?  ?  ?  ? ?[name] ? ? ? ? ?

## 2021-10-25 ENCOUNTER — Encounter (HOSPITAL_BASED_OUTPATIENT_CLINIC_OR_DEPARTMENT_OTHER): Payer: Self-pay | Admitting: Orthopaedic Surgery

## 2021-10-25 DIAGNOSIS — M25611 Stiffness of right shoulder, not elsewhere classified: Secondary | ICD-10-CM | POA: Diagnosis not present

## 2021-10-25 DIAGNOSIS — M25511 Pain in right shoulder: Secondary | ICD-10-CM | POA: Diagnosis not present

## 2021-10-26 DIAGNOSIS — M19011 Primary osteoarthritis, right shoulder: Secondary | ICD-10-CM | POA: Diagnosis not present

## 2021-11-01 DIAGNOSIS — M25611 Stiffness of right shoulder, not elsewhere classified: Secondary | ICD-10-CM | POA: Diagnosis not present

## 2021-11-01 DIAGNOSIS — M25511 Pain in right shoulder: Secondary | ICD-10-CM | POA: Diagnosis not present

## 2021-11-08 DIAGNOSIS — M25611 Stiffness of right shoulder, not elsewhere classified: Secondary | ICD-10-CM | POA: Diagnosis not present

## 2021-11-08 DIAGNOSIS — M25511 Pain in right shoulder: Secondary | ICD-10-CM | POA: Diagnosis not present

## 2021-11-15 DIAGNOSIS — M25511 Pain in right shoulder: Secondary | ICD-10-CM | POA: Diagnosis not present

## 2021-11-15 DIAGNOSIS — M25611 Stiffness of right shoulder, not elsewhere classified: Secondary | ICD-10-CM | POA: Diagnosis not present

## 2021-11-16 DIAGNOSIS — M19011 Primary osteoarthritis, right shoulder: Secondary | ICD-10-CM | POA: Diagnosis not present

## 2021-11-22 DIAGNOSIS — M25511 Pain in right shoulder: Secondary | ICD-10-CM | POA: Diagnosis not present

## 2021-11-22 DIAGNOSIS — M25611 Stiffness of right shoulder, not elsewhere classified: Secondary | ICD-10-CM | POA: Diagnosis not present

## 2021-11-25 DIAGNOSIS — M25511 Pain in right shoulder: Secondary | ICD-10-CM | POA: Diagnosis not present

## 2021-11-25 DIAGNOSIS — M25611 Stiffness of right shoulder, not elsewhere classified: Secondary | ICD-10-CM | POA: Diagnosis not present

## 2021-11-29 DIAGNOSIS — M25511 Pain in right shoulder: Secondary | ICD-10-CM | POA: Diagnosis not present

## 2021-11-29 DIAGNOSIS — M25611 Stiffness of right shoulder, not elsewhere classified: Secondary | ICD-10-CM | POA: Diagnosis not present

## 2021-12-02 DIAGNOSIS — M25611 Stiffness of right shoulder, not elsewhere classified: Secondary | ICD-10-CM | POA: Diagnosis not present

## 2021-12-02 DIAGNOSIS — M25511 Pain in right shoulder: Secondary | ICD-10-CM | POA: Diagnosis not present

## 2021-12-06 DIAGNOSIS — M25511 Pain in right shoulder: Secondary | ICD-10-CM | POA: Diagnosis not present

## 2021-12-06 DIAGNOSIS — M25611 Stiffness of right shoulder, not elsewhere classified: Secondary | ICD-10-CM | POA: Diagnosis not present

## 2021-12-08 DIAGNOSIS — M546 Pain in thoracic spine: Secondary | ICD-10-CM | POA: Diagnosis not present

## 2021-12-08 DIAGNOSIS — M9901 Segmental and somatic dysfunction of cervical region: Secondary | ICD-10-CM | POA: Diagnosis not present

## 2021-12-08 DIAGNOSIS — M9902 Segmental and somatic dysfunction of thoracic region: Secondary | ICD-10-CM | POA: Diagnosis not present

## 2021-12-08 DIAGNOSIS — M9908 Segmental and somatic dysfunction of rib cage: Secondary | ICD-10-CM | POA: Diagnosis not present

## 2021-12-09 DIAGNOSIS — M25511 Pain in right shoulder: Secondary | ICD-10-CM | POA: Diagnosis not present

## 2021-12-09 DIAGNOSIS — M25611 Stiffness of right shoulder, not elsewhere classified: Secondary | ICD-10-CM | POA: Diagnosis not present

## 2021-12-13 DIAGNOSIS — M25511 Pain in right shoulder: Secondary | ICD-10-CM | POA: Diagnosis not present

## 2021-12-13 DIAGNOSIS — M25611 Stiffness of right shoulder, not elsewhere classified: Secondary | ICD-10-CM | POA: Diagnosis not present

## 2021-12-16 DIAGNOSIS — M25611 Stiffness of right shoulder, not elsewhere classified: Secondary | ICD-10-CM | POA: Diagnosis not present

## 2021-12-16 DIAGNOSIS — M25511 Pain in right shoulder: Secondary | ICD-10-CM | POA: Diagnosis not present

## 2021-12-23 DIAGNOSIS — M25611 Stiffness of right shoulder, not elsewhere classified: Secondary | ICD-10-CM | POA: Diagnosis not present

## 2021-12-23 DIAGNOSIS — M25511 Pain in right shoulder: Secondary | ICD-10-CM | POA: Diagnosis not present

## 2021-12-27 DIAGNOSIS — M25611 Stiffness of right shoulder, not elsewhere classified: Secondary | ICD-10-CM | POA: Diagnosis not present

## 2021-12-27 DIAGNOSIS — M25511 Pain in right shoulder: Secondary | ICD-10-CM | POA: Diagnosis not present

## 2021-12-28 DIAGNOSIS — M19011 Primary osteoarthritis, right shoulder: Secondary | ICD-10-CM | POA: Diagnosis not present

## 2021-12-30 DIAGNOSIS — M25511 Pain in right shoulder: Secondary | ICD-10-CM | POA: Diagnosis not present

## 2021-12-30 DIAGNOSIS — M25611 Stiffness of right shoulder, not elsewhere classified: Secondary | ICD-10-CM | POA: Diagnosis not present

## 2021-12-31 DIAGNOSIS — M9902 Segmental and somatic dysfunction of thoracic region: Secondary | ICD-10-CM | POA: Diagnosis not present

## 2021-12-31 DIAGNOSIS — M9908 Segmental and somatic dysfunction of rib cage: Secondary | ICD-10-CM | POA: Diagnosis not present

## 2021-12-31 DIAGNOSIS — M546 Pain in thoracic spine: Secondary | ICD-10-CM | POA: Diagnosis not present

## 2021-12-31 DIAGNOSIS — M9907 Segmental and somatic dysfunction of upper extremity: Secondary | ICD-10-CM | POA: Diagnosis not present

## 2022-01-03 DIAGNOSIS — M25511 Pain in right shoulder: Secondary | ICD-10-CM | POA: Diagnosis not present

## 2022-01-03 DIAGNOSIS — M25611 Stiffness of right shoulder, not elsewhere classified: Secondary | ICD-10-CM | POA: Diagnosis not present

## 2022-01-04 DIAGNOSIS — M810 Age-related osteoporosis without current pathological fracture: Secondary | ICD-10-CM | POA: Diagnosis not present

## 2022-01-06 DIAGNOSIS — M25511 Pain in right shoulder: Secondary | ICD-10-CM | POA: Diagnosis not present

## 2022-01-06 DIAGNOSIS — M25611 Stiffness of right shoulder, not elsewhere classified: Secondary | ICD-10-CM | POA: Diagnosis not present

## 2022-01-10 DIAGNOSIS — M25511 Pain in right shoulder: Secondary | ICD-10-CM | POA: Diagnosis not present

## 2022-01-10 DIAGNOSIS — M25611 Stiffness of right shoulder, not elsewhere classified: Secondary | ICD-10-CM | POA: Diagnosis not present

## 2022-01-13 DIAGNOSIS — M25511 Pain in right shoulder: Secondary | ICD-10-CM | POA: Diagnosis not present

## 2022-01-13 DIAGNOSIS — R066 Hiccough: Secondary | ICD-10-CM | POA: Diagnosis not present

## 2022-01-13 DIAGNOSIS — Z1211 Encounter for screening for malignant neoplasm of colon: Secondary | ICD-10-CM | POA: Diagnosis not present

## 2022-01-13 DIAGNOSIS — M25611 Stiffness of right shoulder, not elsewhere classified: Secondary | ICD-10-CM | POA: Diagnosis not present

## 2022-01-18 DIAGNOSIS — M25611 Stiffness of right shoulder, not elsewhere classified: Secondary | ICD-10-CM | POA: Diagnosis not present

## 2022-01-18 DIAGNOSIS — M25511 Pain in right shoulder: Secondary | ICD-10-CM | POA: Diagnosis not present

## 2022-01-20 DIAGNOSIS — M25611 Stiffness of right shoulder, not elsewhere classified: Secondary | ICD-10-CM | POA: Diagnosis not present

## 2022-01-20 DIAGNOSIS — M25511 Pain in right shoulder: Secondary | ICD-10-CM | POA: Diagnosis not present

## 2022-01-24 DIAGNOSIS — M25511 Pain in right shoulder: Secondary | ICD-10-CM | POA: Diagnosis not present

## 2022-01-24 DIAGNOSIS — M25611 Stiffness of right shoulder, not elsewhere classified: Secondary | ICD-10-CM | POA: Diagnosis not present

## 2022-01-25 DIAGNOSIS — M19011 Primary osteoarthritis, right shoulder: Secondary | ICD-10-CM | POA: Diagnosis not present

## 2022-02-01 DIAGNOSIS — M25511 Pain in right shoulder: Secondary | ICD-10-CM | POA: Diagnosis not present

## 2022-02-01 DIAGNOSIS — M25611 Stiffness of right shoulder, not elsewhere classified: Secondary | ICD-10-CM | POA: Diagnosis not present

## 2022-02-03 DIAGNOSIS — M25511 Pain in right shoulder: Secondary | ICD-10-CM | POA: Diagnosis not present

## 2022-02-03 DIAGNOSIS — M25611 Stiffness of right shoulder, not elsewhere classified: Secondary | ICD-10-CM | POA: Diagnosis not present

## 2022-02-09 DIAGNOSIS — M810 Age-related osteoporosis without current pathological fracture: Secondary | ICD-10-CM | POA: Diagnosis not present

## 2022-02-09 DIAGNOSIS — M25511 Pain in right shoulder: Secondary | ICD-10-CM | POA: Diagnosis not present

## 2022-02-09 DIAGNOSIS — M25611 Stiffness of right shoulder, not elsewhere classified: Secondary | ICD-10-CM | POA: Diagnosis not present

## 2022-02-14 DIAGNOSIS — M25511 Pain in right shoulder: Secondary | ICD-10-CM | POA: Diagnosis not present

## 2022-02-23 DIAGNOSIS — M25611 Stiffness of right shoulder, not elsewhere classified: Secondary | ICD-10-CM | POA: Diagnosis not present

## 2022-02-23 DIAGNOSIS — M25511 Pain in right shoulder: Secondary | ICD-10-CM | POA: Diagnosis not present

## 2022-03-30 DIAGNOSIS — K4090 Unilateral inguinal hernia, without obstruction or gangrene, not specified as recurrent: Secondary | ICD-10-CM | POA: Diagnosis not present

## 2022-03-30 DIAGNOSIS — R1011 Right upper quadrant pain: Secondary | ICD-10-CM | POA: Diagnosis not present

## 2022-03-30 DIAGNOSIS — M810 Age-related osteoporosis without current pathological fracture: Secondary | ICD-10-CM | POA: Diagnosis not present

## 2022-03-30 DIAGNOSIS — H9193 Unspecified hearing loss, bilateral: Secondary | ICD-10-CM | POA: Diagnosis not present

## 2022-03-30 DIAGNOSIS — R1012 Left upper quadrant pain: Secondary | ICD-10-CM | POA: Diagnosis not present

## 2022-03-30 DIAGNOSIS — H9313 Tinnitus, bilateral: Secondary | ICD-10-CM | POA: Diagnosis not present

## 2022-04-04 DIAGNOSIS — L723 Sebaceous cyst: Secondary | ICD-10-CM | POA: Diagnosis not present

## 2022-04-04 DIAGNOSIS — L308 Other specified dermatitis: Secondary | ICD-10-CM | POA: Diagnosis not present

## 2022-04-04 DIAGNOSIS — L570 Actinic keratosis: Secondary | ICD-10-CM | POA: Diagnosis not present

## 2022-04-04 DIAGNOSIS — Z85828 Personal history of other malignant neoplasm of skin: Secondary | ICD-10-CM | POA: Diagnosis not present

## 2022-04-04 DIAGNOSIS — D485 Neoplasm of uncertain behavior of skin: Secondary | ICD-10-CM | POA: Diagnosis not present

## 2022-04-22 DIAGNOSIS — H16223 Keratoconjunctivitis sicca, not specified as Sjogren's, bilateral: Secondary | ICD-10-CM | POA: Diagnosis not present

## 2022-05-18 DIAGNOSIS — K4090 Unilateral inguinal hernia, without obstruction or gangrene, not specified as recurrent: Secondary | ICD-10-CM | POA: Diagnosis not present

## 2022-05-20 DIAGNOSIS — K5730 Diverticulosis of large intestine without perforation or abscess without bleeding: Secondary | ICD-10-CM | POA: Diagnosis not present

## 2022-05-20 DIAGNOSIS — D122 Benign neoplasm of ascending colon: Secondary | ICD-10-CM | POA: Diagnosis not present

## 2022-05-20 DIAGNOSIS — Z1211 Encounter for screening for malignant neoplasm of colon: Secondary | ICD-10-CM | POA: Diagnosis not present

## 2022-05-20 DIAGNOSIS — K649 Unspecified hemorrhoids: Secondary | ICD-10-CM | POA: Diagnosis not present

## 2022-05-20 DIAGNOSIS — D120 Benign neoplasm of cecum: Secondary | ICD-10-CM | POA: Diagnosis not present

## 2022-05-24 DIAGNOSIS — D122 Benign neoplasm of ascending colon: Secondary | ICD-10-CM | POA: Diagnosis not present

## 2022-05-24 DIAGNOSIS — D120 Benign neoplasm of cecum: Secondary | ICD-10-CM | POA: Diagnosis not present

## 2022-07-01 DIAGNOSIS — M19011 Primary osteoarthritis, right shoulder: Secondary | ICD-10-CM | POA: Diagnosis not present

## 2022-08-04 DIAGNOSIS — M810 Age-related osteoporosis without current pathological fracture: Secondary | ICD-10-CM | POA: Diagnosis not present

## 2022-08-08 DIAGNOSIS — H9313 Tinnitus, bilateral: Secondary | ICD-10-CM | POA: Diagnosis not present

## 2022-08-08 DIAGNOSIS — H903 Sensorineural hearing loss, bilateral: Secondary | ICD-10-CM | POA: Diagnosis not present

## 2022-08-13 IMAGING — CR DG SHOULDER 1V*R*
1 series · 1 of 1 positions shown · non-contrast
Comparison: CT shoulder 09/15/2021

CLINICAL DATA: Status post right shoulder replacement

EXAM:
RIGHT SHOULDER - 1 VIEW

[shoulder ap]
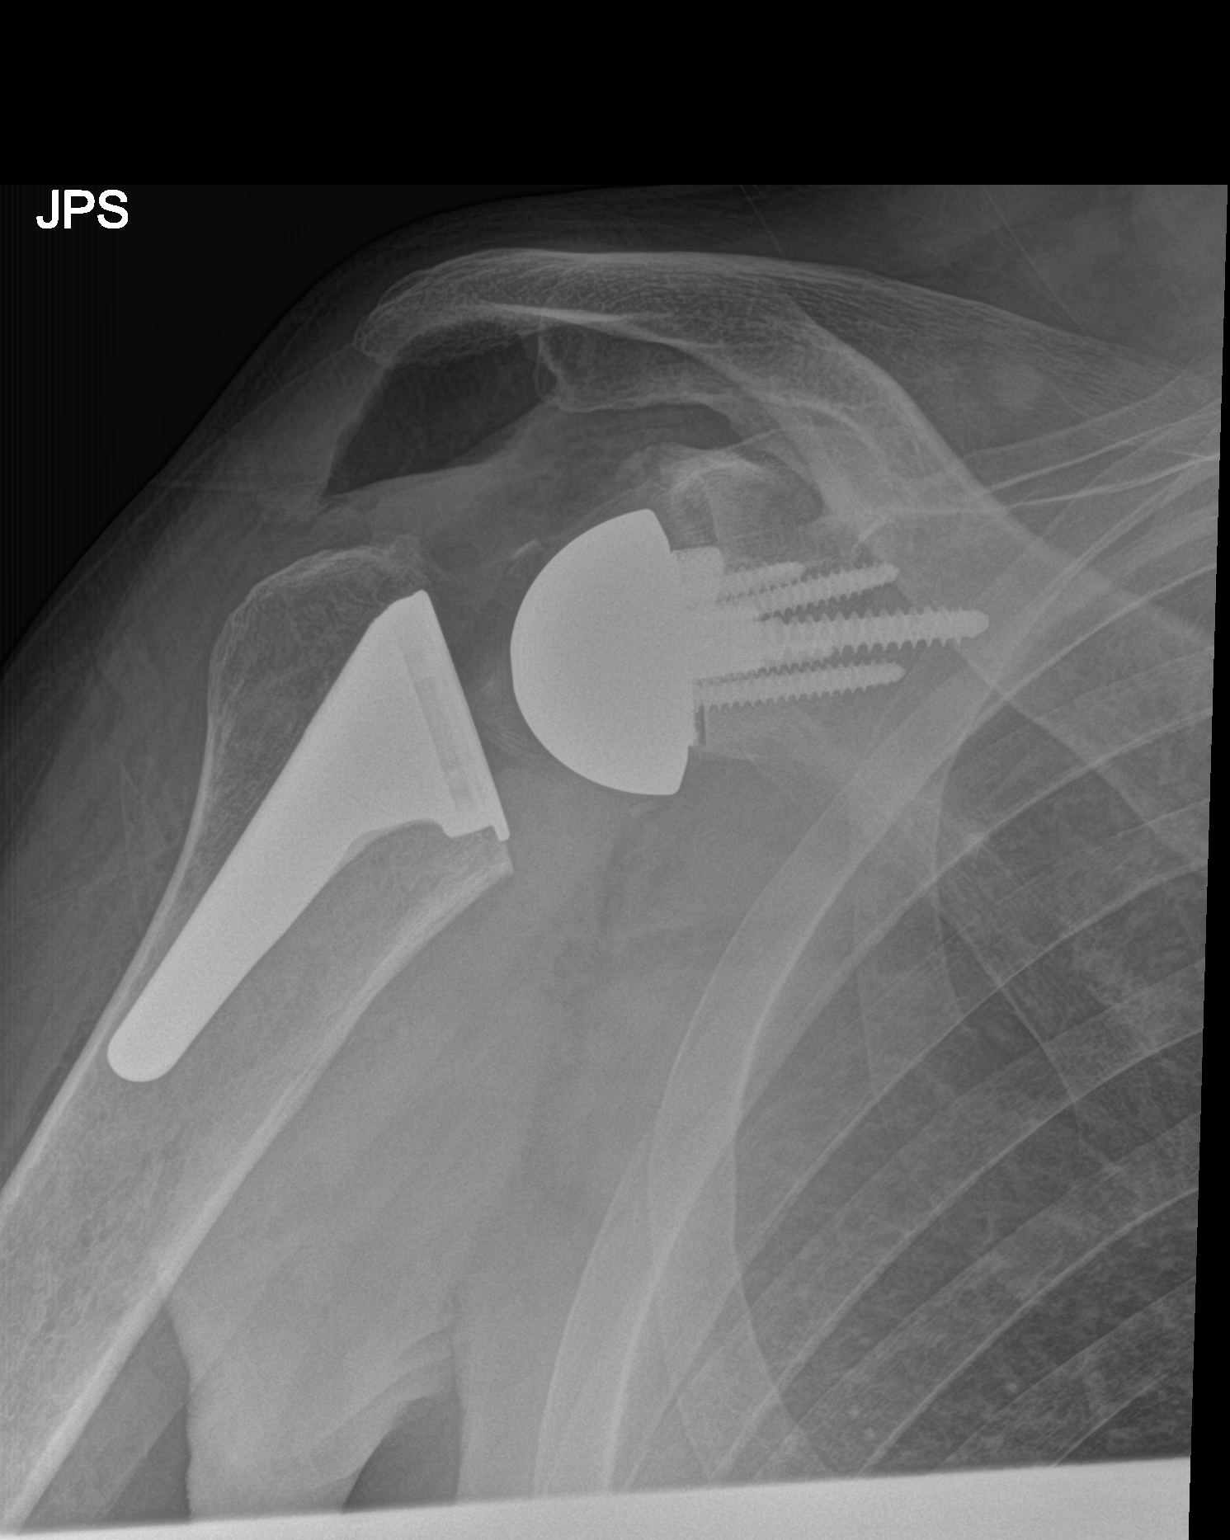

[1 of 1 positions shown; findings below may reference images not displayed]

FINDINGS: A single view of the right shoulder is provided. The patient is
status post reverse shoulder arthroplasty. Hardware alignment is
within expected limits, without evidence of complication on the
provided view. Surrounding soft tissue gas is likely postoperative
in nature.
IMPRESSION: Status post reverse shoulder arthroplasty without evidence of
complication.

## 2022-08-25 DIAGNOSIS — K4090 Unilateral inguinal hernia, without obstruction or gangrene, not specified as recurrent: Secondary | ICD-10-CM | POA: Diagnosis not present

## 2022-09-12 DIAGNOSIS — M810 Age-related osteoporosis without current pathological fracture: Secondary | ICD-10-CM | POA: Diagnosis not present

## 2022-10-04 DIAGNOSIS — M810 Age-related osteoporosis without current pathological fracture: Secondary | ICD-10-CM | POA: Diagnosis not present

## 2022-10-12 DIAGNOSIS — Z1331 Encounter for screening for depression: Secondary | ICD-10-CM | POA: Diagnosis not present

## 2022-10-12 DIAGNOSIS — K635 Polyp of colon: Secondary | ICD-10-CM | POA: Diagnosis not present

## 2022-10-12 DIAGNOSIS — Z0000 Encounter for general adult medical examination without abnormal findings: Secondary | ICD-10-CM | POA: Diagnosis not present

## 2022-10-12 DIAGNOSIS — Z125 Encounter for screening for malignant neoplasm of prostate: Secondary | ICD-10-CM | POA: Diagnosis not present

## 2022-10-12 DIAGNOSIS — M818 Other osteoporosis without current pathological fracture: Secondary | ICD-10-CM | POA: Diagnosis not present

## 2022-10-12 DIAGNOSIS — E039 Hypothyroidism, unspecified: Secondary | ICD-10-CM | POA: Diagnosis not present

## 2022-10-17 DIAGNOSIS — R972 Elevated prostate specific antigen [PSA]: Secondary | ICD-10-CM | POA: Diagnosis not present

## 2022-10-17 DIAGNOSIS — R3913 Splitting of urinary stream: Secondary | ICD-10-CM | POA: Diagnosis not present

## 2022-10-17 DIAGNOSIS — N403 Nodular prostate with lower urinary tract symptoms: Secondary | ICD-10-CM | POA: Diagnosis not present

## 2022-10-17 DIAGNOSIS — R3912 Poor urinary stream: Secondary | ICD-10-CM | POA: Diagnosis not present

## 2022-10-24 ENCOUNTER — Other Ambulatory Visit: Payer: Self-pay | Admitting: Urology

## 2022-10-24 DIAGNOSIS — N403 Nodular prostate with lower urinary tract symptoms: Secondary | ICD-10-CM

## 2022-10-24 DIAGNOSIS — R972 Elevated prostate specific antigen [PSA]: Secondary | ICD-10-CM

## 2022-11-24 ENCOUNTER — Other Ambulatory Visit: Payer: Medicare Other

## 2022-11-26 ENCOUNTER — Ambulatory Visit
Admission: RE | Admit: 2022-11-26 | Discharge: 2022-11-26 | Disposition: A | Discharge status: Home / Self Care | Payer: Medicare Other | Source: Ambulatory Visit | Attending: Urology | Admitting: Urology

## 2022-11-26 DIAGNOSIS — R972 Elevated prostate specific antigen [PSA]: Secondary | ICD-10-CM | POA: Diagnosis not present

## 2022-11-26 DIAGNOSIS — N403 Nodular prostate with lower urinary tract symptoms: Secondary | ICD-10-CM

## 2022-11-26 MED ORDER — GADOPICLENOL 0.5 MMOL/ML IV SOLN
7.5000 mL | Freq: Once | INTRAVENOUS | Status: AC | PRN
  Administered 2022-11-26: 7.5 mL via INTRAVENOUS

## 2022-11-28 DIAGNOSIS — R972 Elevated prostate specific antigen [PSA]: Secondary | ICD-10-CM | POA: Diagnosis not present

## 2022-12-21 DIAGNOSIS — N35914 Unspecified anterior urethral stricture, male: Secondary | ICD-10-CM | POA: Diagnosis not present

## 2023-01-13 ENCOUNTER — Other Ambulatory Visit: Payer: Self-pay | Admitting: Orthopaedic Surgery

## 2023-01-13 DIAGNOSIS — Z96612 Presence of left artificial shoulder joint: Secondary | ICD-10-CM

## 2023-01-13 DIAGNOSIS — M19011 Primary osteoarthritis, right shoulder: Secondary | ICD-10-CM | POA: Diagnosis not present

## 2023-01-13 DIAGNOSIS — M25512 Pain in left shoulder: Secondary | ICD-10-CM | POA: Diagnosis not present

## 2023-01-25 DIAGNOSIS — R972 Elevated prostate specific antigen [PSA]: Secondary | ICD-10-CM | POA: Diagnosis not present

## 2023-02-20 DIAGNOSIS — R972 Elevated prostate specific antigen [PSA]: Secondary | ICD-10-CM | POA: Diagnosis not present

## 2023-02-20 DIAGNOSIS — E039 Hypothyroidism, unspecified: Secondary | ICD-10-CM | POA: Diagnosis not present

## 2023-02-20 DIAGNOSIS — Z6822 Body mass index (BMI) 22.0-22.9, adult: Secondary | ICD-10-CM | POA: Diagnosis not present

## 2023-02-20 DIAGNOSIS — M19012 Primary osteoarthritis, left shoulder: Secondary | ICD-10-CM | POA: Diagnosis not present

## 2023-02-20 DIAGNOSIS — N35011 Post-traumatic bulbous urethral stricture: Secondary | ICD-10-CM | POA: Diagnosis not present

## 2023-02-20 DIAGNOSIS — Z01818 Encounter for other preprocedural examination: Secondary | ICD-10-CM | POA: Diagnosis not present

## 2023-04-05 DIAGNOSIS — M9905 Segmental and somatic dysfunction of pelvic region: Secondary | ICD-10-CM | POA: Diagnosis not present

## 2023-04-05 DIAGNOSIS — M546 Pain in thoracic spine: Secondary | ICD-10-CM | POA: Diagnosis not present

## 2023-04-05 DIAGNOSIS — M9906 Segmental and somatic dysfunction of lower extremity: Secondary | ICD-10-CM | POA: Diagnosis not present

## 2023-04-05 DIAGNOSIS — M25561 Pain in right knee: Secondary | ICD-10-CM | POA: Diagnosis not present

## 2023-04-17 DIAGNOSIS — M9902 Segmental and somatic dysfunction of thoracic region: Secondary | ICD-10-CM | POA: Diagnosis not present

## 2023-04-17 DIAGNOSIS — M9903 Segmental and somatic dysfunction of lumbar region: Secondary | ICD-10-CM | POA: Diagnosis not present

## 2023-04-17 DIAGNOSIS — M9905 Segmental and somatic dysfunction of pelvic region: Secondary | ICD-10-CM | POA: Diagnosis not present

## 2023-04-17 DIAGNOSIS — M4604 Spinal enthesopathy, thoracic region: Secondary | ICD-10-CM | POA: Diagnosis not present

## 2023-04-20 DIAGNOSIS — M9905 Segmental and somatic dysfunction of pelvic region: Secondary | ICD-10-CM | POA: Diagnosis not present

## 2023-04-20 DIAGNOSIS — M4604 Spinal enthesopathy, thoracic region: Secondary | ICD-10-CM | POA: Diagnosis not present

## 2023-04-20 DIAGNOSIS — M9902 Segmental and somatic dysfunction of thoracic region: Secondary | ICD-10-CM | POA: Diagnosis not present

## 2023-04-20 DIAGNOSIS — M9903 Segmental and somatic dysfunction of lumbar region: Secondary | ICD-10-CM | POA: Diagnosis not present

## 2023-04-25 DIAGNOSIS — M9902 Segmental and somatic dysfunction of thoracic region: Secondary | ICD-10-CM | POA: Diagnosis not present

## 2023-04-25 DIAGNOSIS — M9905 Segmental and somatic dysfunction of pelvic region: Secondary | ICD-10-CM | POA: Diagnosis not present

## 2023-04-25 DIAGNOSIS — M4604 Spinal enthesopathy, thoracic region: Secondary | ICD-10-CM | POA: Diagnosis not present

## 2023-04-25 DIAGNOSIS — M9903 Segmental and somatic dysfunction of lumbar region: Secondary | ICD-10-CM | POA: Diagnosis not present

## 2023-04-26 ENCOUNTER — Ambulatory Visit
Admission: RE | Admit: 2023-04-26 | Discharge: 2023-04-26 | Disposition: A | Discharge status: Home / Self Care | Payer: Medicare Other | Source: Ambulatory Visit | Attending: Orthopaedic Surgery | Admitting: Orthopaedic Surgery

## 2023-04-26 DIAGNOSIS — M25512 Pain in left shoulder: Secondary | ICD-10-CM | POA: Diagnosis not present

## 2023-04-26 DIAGNOSIS — M19012 Primary osteoarthritis, left shoulder: Secondary | ICD-10-CM | POA: Diagnosis not present

## 2023-04-26 DIAGNOSIS — M19011 Primary osteoarthritis, right shoulder: Secondary | ICD-10-CM | POA: Diagnosis not present

## 2023-04-26 DIAGNOSIS — S42142K Displaced fracture of glenoid cavity of scapula, left shoulder, subsequent encounter for fracture with nonunion: Secondary | ICD-10-CM | POA: Diagnosis not present

## 2023-04-26 DIAGNOSIS — M25511 Pain in right shoulder: Secondary | ICD-10-CM | POA: Diagnosis not present

## 2023-04-26 DIAGNOSIS — M9901 Segmental and somatic dysfunction of cervical region: Secondary | ICD-10-CM | POA: Diagnosis not present

## 2023-04-26 DIAGNOSIS — M25412 Effusion, left shoulder: Secondary | ICD-10-CM | POA: Diagnosis not present

## 2023-04-26 DIAGNOSIS — M546 Pain in thoracic spine: Secondary | ICD-10-CM | POA: Diagnosis not present

## 2023-04-26 DIAGNOSIS — M9908 Segmental and somatic dysfunction of rib cage: Secondary | ICD-10-CM | POA: Diagnosis not present

## 2023-04-26 DIAGNOSIS — M9902 Segmental and somatic dysfunction of thoracic region: Secondary | ICD-10-CM | POA: Diagnosis not present

## 2023-04-26 DIAGNOSIS — Z96612 Presence of left artificial shoulder joint: Secondary | ICD-10-CM

## 2023-04-26 DIAGNOSIS — M24012 Loose body in left shoulder: Secondary | ICD-10-CM | POA: Diagnosis not present

## 2023-05-19 DIAGNOSIS — M19011 Primary osteoarthritis, right shoulder: Secondary | ICD-10-CM | POA: Diagnosis not present

## 2023-05-23 DIAGNOSIS — M19012 Primary osteoarthritis, left shoulder: Secondary | ICD-10-CM | POA: Diagnosis not present

## 2023-05-23 DIAGNOSIS — M9907 Segmental and somatic dysfunction of upper extremity: Secondary | ICD-10-CM | POA: Diagnosis not present

## 2023-05-23 DIAGNOSIS — M9908 Segmental and somatic dysfunction of rib cage: Secondary | ICD-10-CM | POA: Diagnosis not present

## 2023-05-23 DIAGNOSIS — M25512 Pain in left shoulder: Secondary | ICD-10-CM | POA: Diagnosis not present

## 2023-05-23 DIAGNOSIS — M9902 Segmental and somatic dysfunction of thoracic region: Secondary | ICD-10-CM | POA: Diagnosis not present

## 2023-05-23 DIAGNOSIS — M810 Age-related osteoporosis without current pathological fracture: Secondary | ICD-10-CM | POA: Diagnosis not present

## 2023-05-23 DIAGNOSIS — M9901 Segmental and somatic dysfunction of cervical region: Secondary | ICD-10-CM | POA: Diagnosis not present

## 2023-05-23 DIAGNOSIS — M62830 Muscle spasm of back: Secondary | ICD-10-CM | POA: Diagnosis not present

## 2023-06-06 DIAGNOSIS — M9901 Segmental and somatic dysfunction of cervical region: Secondary | ICD-10-CM | POA: Diagnosis not present

## 2023-06-06 DIAGNOSIS — M9902 Segmental and somatic dysfunction of thoracic region: Secondary | ICD-10-CM | POA: Diagnosis not present

## 2023-06-06 DIAGNOSIS — M62830 Muscle spasm of back: Secondary | ICD-10-CM | POA: Diagnosis not present

## 2023-06-06 DIAGNOSIS — M25512 Pain in left shoulder: Secondary | ICD-10-CM | POA: Diagnosis not present

## 2023-06-06 DIAGNOSIS — M9908 Segmental and somatic dysfunction of rib cage: Secondary | ICD-10-CM | POA: Diagnosis not present

## 2023-06-06 DIAGNOSIS — M19012 Primary osteoarthritis, left shoulder: Secondary | ICD-10-CM | POA: Diagnosis not present

## 2023-06-06 DIAGNOSIS — M9903 Segmental and somatic dysfunction of lumbar region: Secondary | ICD-10-CM | POA: Diagnosis not present

## 2023-06-06 DIAGNOSIS — M9907 Segmental and somatic dysfunction of upper extremity: Secondary | ICD-10-CM | POA: Diagnosis not present

## 2023-06-26 DIAGNOSIS — M19012 Primary osteoarthritis, left shoulder: Secondary | ICD-10-CM | POA: Diagnosis not present

## 2023-06-26 DIAGNOSIS — M9901 Segmental and somatic dysfunction of cervical region: Secondary | ICD-10-CM | POA: Diagnosis not present

## 2023-06-26 DIAGNOSIS — M9902 Segmental and somatic dysfunction of thoracic region: Secondary | ICD-10-CM | POA: Diagnosis not present

## 2023-06-26 DIAGNOSIS — M9907 Segmental and somatic dysfunction of upper extremity: Secondary | ICD-10-CM | POA: Diagnosis not present

## 2023-06-26 DIAGNOSIS — M25512 Pain in left shoulder: Secondary | ICD-10-CM | POA: Diagnosis not present

## 2023-06-28 DIAGNOSIS — M25512 Pain in left shoulder: Secondary | ICD-10-CM | POA: Diagnosis not present

## 2023-07-11 DIAGNOSIS — M9908 Segmental and somatic dysfunction of rib cage: Secondary | ICD-10-CM | POA: Diagnosis not present

## 2023-07-11 DIAGNOSIS — M9901 Segmental and somatic dysfunction of cervical region: Secondary | ICD-10-CM | POA: Diagnosis not present

## 2023-07-11 DIAGNOSIS — M9907 Segmental and somatic dysfunction of upper extremity: Secondary | ICD-10-CM | POA: Diagnosis not present

## 2023-07-11 DIAGNOSIS — M25512 Pain in left shoulder: Secondary | ICD-10-CM | POA: Diagnosis not present

## 2023-07-11 DIAGNOSIS — M19012 Primary osteoarthritis, left shoulder: Secondary | ICD-10-CM | POA: Diagnosis not present

## 2023-07-26 DIAGNOSIS — H43813 Vitreous degeneration, bilateral: Secondary | ICD-10-CM | POA: Diagnosis not present

## 2023-07-27 DIAGNOSIS — M170 Bilateral primary osteoarthritis of knee: Secondary | ICD-10-CM | POA: Diagnosis not present

## 2023-07-31 DIAGNOSIS — L570 Actinic keratosis: Secondary | ICD-10-CM | POA: Diagnosis not present

## 2023-07-31 DIAGNOSIS — L820 Inflamed seborrheic keratosis: Secondary | ICD-10-CM | POA: Diagnosis not present

## 2023-07-31 DIAGNOSIS — Z85828 Personal history of other malignant neoplasm of skin: Secondary | ICD-10-CM | POA: Diagnosis not present

## 2023-08-08 DIAGNOSIS — M25512 Pain in left shoulder: Secondary | ICD-10-CM | POA: Diagnosis not present

## 2023-08-08 DIAGNOSIS — M9901 Segmental and somatic dysfunction of cervical region: Secondary | ICD-10-CM | POA: Diagnosis not present

## 2023-08-08 DIAGNOSIS — M6281 Muscle weakness (generalized): Secondary | ICD-10-CM | POA: Diagnosis not present

## 2023-08-08 DIAGNOSIS — M9907 Segmental and somatic dysfunction of upper extremity: Secondary | ICD-10-CM | POA: Diagnosis not present

## 2023-08-08 DIAGNOSIS — M9909 Segmental and somatic dysfunction of abdomen and other regions: Secondary | ICD-10-CM | POA: Diagnosis not present

## 2023-08-08 DIAGNOSIS — M19012 Primary osteoarthritis, left shoulder: Secondary | ICD-10-CM | POA: Diagnosis not present

## 2023-08-08 DIAGNOSIS — M9902 Segmental and somatic dysfunction of thoracic region: Secondary | ICD-10-CM | POA: Diagnosis not present

## 2023-08-09 DIAGNOSIS — Z9889 Other specified postprocedural states: Secondary | ICD-10-CM | POA: Diagnosis not present

## 2023-08-09 DIAGNOSIS — M19012 Primary osteoarthritis, left shoulder: Secondary | ICD-10-CM | POA: Diagnosis not present

## 2023-08-09 DIAGNOSIS — G8918 Other acute postprocedural pain: Secondary | ICD-10-CM | POA: Diagnosis not present

## 2023-08-09 DIAGNOSIS — M24512 Contracture, left shoulder: Secondary | ICD-10-CM | POA: Diagnosis not present

## 2023-08-15 DIAGNOSIS — M19012 Primary osteoarthritis, left shoulder: Secondary | ICD-10-CM | POA: Diagnosis not present

## 2023-12-26 ENCOUNTER — Other Ambulatory Visit: Payer: Self-pay | Admitting: Urology

## 2023-12-26 DIAGNOSIS — R972 Elevated prostate specific antigen [PSA]: Secondary | ICD-10-CM

## 2024-01-25 ENCOUNTER — Other Ambulatory Visit: Payer: Self-pay | Admitting: Gastroenterology

## 2024-01-25 DIAGNOSIS — K639 Disease of intestine, unspecified: Secondary | ICD-10-CM

## 2024-02-05 ENCOUNTER — Ambulatory Visit
Admission: RE | Admit: 2024-02-05 | Discharge: 2024-02-05 | Disposition: A | Discharge status: Home / Self Care | Source: Ambulatory Visit | Attending: Gastroenterology | Admitting: Gastroenterology

## 2024-02-05 DIAGNOSIS — K639 Disease of intestine, unspecified: Secondary | ICD-10-CM

## 2024-02-05 MED ORDER — IOPAMIDOL (ISOVUE-300) INJECTION 61%
75.0000 mL | Freq: Once | INTRAVENOUS | Status: AC | PRN
  Administered 2024-02-05: 75 mL via INTRAVENOUS

## 2024-02-26 ENCOUNTER — Other Ambulatory Visit

## 2024-02-28 ENCOUNTER — Ambulatory Visit
Admission: RE | Admit: 2024-02-28 | Discharge: 2024-02-28 | Disposition: A | Discharge status: Home / Self Care | Source: Ambulatory Visit | Attending: Urology

## 2024-02-28 DIAGNOSIS — R972 Elevated prostate specific antigen [PSA]: Secondary | ICD-10-CM

## 2024-02-28 MED ORDER — GADOPICLENOL 0.5 MMOL/ML IV SOLN
9.0000 mL | Freq: Once | INTRAVENOUS | Status: AC | PRN
  Administered 2024-02-28: 9 mL via INTRAVENOUS

## 2024-08-23 ENCOUNTER — Ambulatory Visit: Admitting: Plastic Surgery

## 2024-08-23 VITALS — BP 121/85 | HR 85 | Ht 74.0 in | Wt 193.8 lb

## 2024-08-23 DIAGNOSIS — R2230 Localized swelling, mass and lump, unspecified upper limb: Secondary | ICD-10-CM | POA: Insufficient documentation

## 2024-08-23 DIAGNOSIS — R2232 Localized swelling, mass and lump, left upper limb: Secondary | ICD-10-CM

## 2024-08-23 NOTE — Progress Notes (Signed)
 "    Patient ID: Greg Santiago, male    DOB: 1945/12/29, 79 y.o.   MRN: [id number]   Chief Complaint  Patient presents with   Consult         The patient is a very nice 79 year old male here for evaluation of his left arm.  On the radial aspect of his right proximal forearm he has a 2 x 3 cm mass.  It is likely either a cyst or a lipoma.  He has had a large cyst removed from his back in the past.  It is been there for several years and seems to be getting larger.  He is a very active gentleman in sports and golfing.  He has had surgery before without complications.  No other concerning areas and no lymphadenopathy noted in the axillary region.    Review of Systems  Constitutional:  Negative for activity change and appetite change.  HENT: Negative.    Eyes: Negative.   Respiratory: Negative.    Cardiovascular: Negative.   Gastrointestinal: Negative.   Endocrine: Negative.   Genitourinary: Negative.   Musculoskeletal: Negative.     Past Medical History:  Diagnosis Date   DJD of right shoulder    Hypothyroidism    Osteoporosis     Past Surgical History:  Procedure Laterality Date   KNEE ARTHROSCOPY     left shoulder surgery Left    REVERSE SHOULDER ARTHROPLASTY Right 10/21/2021   Procedure: REVERSE SHOULDER ARTHROPLASTY;  Surgeon: Cristy Bonner DASEN, MD;  Location: [facility];  Service: Orthopedics;  Laterality: Right;   THYROIDECTOMY, PARTIAL N/A      Current Medications[1]   Objective:   Vitals:   08/23/24 1153  BP: 121/85  Pulse: 85  SpO2: 95%    Physical Exam Vitals reviewed.  HENT:     Head: Atraumatic.  Cardiovascular:     Rate and Rhythm: Normal rate.     Pulses: Normal pulses.  Pulmonary:     Effort: Pulmonary effort is normal.  Musculoskeletal:        General: Deformity present. No swelling, tenderness or signs of injury.       Arms:  Skin:    General: Skin is warm.     Capillary Refill: Capillary refill takes less than 2 seconds.      Coloration: Skin is not jaundiced.     Findings: Lesion present. No bruising.  Neurological:     Mental Status: He is alert and oriented to person, place, and time.  Psychiatric:        Mood and Affect: Mood normal.        Behavior: Behavior normal.        Thought Content: Thought content normal.        Judgment: Judgment normal.     Assessment & Plan:  Mass of left upper extremity  Left arm mass.  Plan excision in clinic.  Depending on what it looks like we will decide whether or not to send it to pathology.  He will have a scar.  He should hold off on any heavy exercise for about 2 weeks using that left arm.  Pictures were obtained of the patient and placed in the chart with the patient's or guardian's permission.   Estefana RAMAN Auren Valdes, DO    [1]  Current Outpatient Medications:    Ascorbic Acid (VITAMIN C) 1000 MG tablet, Take 1,000 mg by mouth daily., Disp: , Rfl:    b complex vitamins capsule, Take 1  capsule by mouth daily., Disp: , Rfl:    Cholecalciferol (VITAMIN D) 50 MCG (2000 UT) tablet, Take 4,000 Units by mouth daily., Disp: , Rfl:    Coenzyme Q10 (COQ10) 100 MG CAPS, Take 100 mg by mouth daily., Disp: , Rfl:    COLLAGEN PO, Take by mouth., Disp: , Rfl:    denosumab  (PROLIA ) 60 MG/ML SOSY injection, Inject 60 mg into the skin every 6 (six) months., Disp: , Rfl:    fluticasone (CUTIVATE) 0.05 % cream, Apply 1 application topically daily as needed for rash., Disp: , Rfl:    Lysine 500 MG CAPS, Take 500 mg by mouth daily., Disp: , Rfl:    MAGNESIUM-POTASSIUM PO, Take 2 tablets by mouth 2 (two) times daily., Disp: , Rfl:    Omega-3 Fatty Acids (FISH OIL) 1200 MG CAPS, Take 2,400 mg by mouth 2 (two) times daily., Disp: , Rfl:    Omega-3 Fatty Acids (OMEGA 3 PO), Take by mouth., Disp: , Rfl:    OVER THE COUNTER MEDICATION, Take 1-2 tablets by mouth See admin instructions. Bone Up otc supplement Take 2 tablets in the morning and 1 tablet in the evening, Disp: , Rfl:     OVER THE COUNTER MEDICATION, Take 2 tablets by mouth daily. CholestMD, Disp: , Rfl:    OVER THE COUNTER MEDICATION, Apply 1 application topically daily as needed (pain). CBD cream, Disp: , Rfl:    Probiotic Product (PROBIOTIC PO), Take 1 capsule by mouth daily., Disp: , Rfl:    SYNTHROID 150 MCG tablet, Take 150 mcg by mouth daily., Disp: , Rfl: 10   TRYPTOPHAN PO, Take 500 mg by mouth at bedtime., Disp: , Rfl:    zinc gluconate 50 MG tablet, Take 50 mg by mouth daily., Disp: , Rfl:   "

## 2024-08-30 ENCOUNTER — Institutional Professional Consult (permissible substitution): Admitting: Plastic Surgery

## 2024-11-22 ENCOUNTER — Ambulatory Visit: Admitting: Plastic Surgery
